# Patient Record
Sex: Male | Born: 1980 | Race: White | Hispanic: No | Marital: Married | Smoking: Former smoker
Health system: Southern US, Community
[De-identification: ages and names within clinical notes are randomized; demographics above are authoritative.]

## PROBLEM LIST (undated history)

## (undated) DIAGNOSIS — N200 Calculus of kidney: Secondary | ICD-10-CM

## (undated) HISTORY — PX: NOSE SURGERY: SHX723

---

## 2000-09-30 ENCOUNTER — Emergency Department (HOSPITAL_COMMUNITY): Admission: EM | Admit: 2000-09-30 | Discharge: 2000-10-01 | Payer: Self-pay | Admitting: Emergency Medicine

## 2002-10-21 ENCOUNTER — Emergency Department (HOSPITAL_COMMUNITY): Admission: EM | Admit: 2002-10-21 | Discharge: 2002-10-21 | Payer: Self-pay | Admitting: Emergency Medicine

## 2016-06-19 ENCOUNTER — Encounter: Payer: Self-pay | Admitting: Emergency Medicine

## 2016-06-19 ENCOUNTER — Emergency Department
Admission: EM | Admit: 2016-06-19 | Discharge: 2016-06-19 | Disposition: A | Discharge status: Home / Self Care | Payer: Self-pay | Attending: Emergency Medicine | Admitting: Emergency Medicine

## 2016-06-19 ENCOUNTER — Emergency Department: Payer: Self-pay

## 2016-06-19 DIAGNOSIS — F17200 Nicotine dependence, unspecified, uncomplicated: Secondary | ICD-10-CM | POA: Insufficient documentation

## 2016-06-19 DIAGNOSIS — R1030 Lower abdominal pain, unspecified: Secondary | ICD-10-CM | POA: Insufficient documentation

## 2016-06-19 DIAGNOSIS — N200 Calculus of kidney: Secondary | ICD-10-CM | POA: Insufficient documentation

## 2016-06-19 HISTORY — DX: Calculus of kidney: N20.0

## 2016-06-19 LAB — URINALYSIS COMPLETE WITH MICROSCOPIC (ARMC ONLY)
Bacteria, UA: NONE SEEN
Bilirubin Urine: NEGATIVE
Glucose, UA: NEGATIVE mg/dL
Ketones, ur: NEGATIVE mg/dL
Leukocytes, UA: NEGATIVE
Nitrite: NEGATIVE
PROTEIN: NEGATIVE mg/dL
Specific Gravity, Urine: 1.019 (ref 1.005–1.030)
pH: 6 (ref 5.0–8.0)

## 2016-06-19 LAB — CHLAMYDIA/NGC RT PCR (ARMC ONLY)
CHLAMYDIA TR: NOT DETECTED
N GONORRHOEAE: NOT DETECTED

## 2016-06-19 MED ORDER — HYDROMORPHONE HCL 2 MG PO TABS: 2.0000 mg | ORAL_TABLET | Freq: Two times a day (BID) | ORAL | Status: DC | PRN

## 2016-06-19 MED ORDER — ONDANSETRON HCL 4 MG PO TABS: 4.0000 mg | ORAL_TABLET | Freq: Three times a day (TID) | ORAL | Status: DC | PRN

## 2016-06-19 MED ORDER — TAMSULOSIN HCL 0.4 MG PO CAPS: 0.4000 mg | ORAL_CAPSULE | Freq: Every day | ORAL | Status: AC

## 2016-06-19 MED ORDER — IBUPROFEN 600 MG PO TABS
600.0000 mg | ORAL_TABLET | Freq: Once | ORAL | Status: AC
  Administered 2016-06-19: 600 mg via ORAL
  Filled 2016-06-19: qty 1

## 2016-06-19 NOTE — Discharge Instructions (Signed)
Kidney Stones °Kidney stones (urolithiasis) are deposits that form inside your kidneys. The intense pain is caused by the stone moving through the urinary tract. When the stone moves, the ureter goes into spasm around the stone. The stone is usually passed in the urine.  °CAUSES  °· A disorder that makes certain neck glands produce too much parathyroid hormone (primary hyperparathyroidism). °· A buildup of uric acid crystals, similar to gout in your joints. °· Narrowing (stricture) of the ureter. °· A kidney obstruction present at birth (congenital obstruction). °· Previous surgery on the kidney or ureters. °· Numerous kidney infections. °SYMPTOMS  °· Feeling sick to your stomach (nauseous). °· Throwing up (vomiting). °· Blood in the urine (hematuria). °· Pain that usually spreads (radiates) to the groin. °· Frequency or urgency of urination. °DIAGNOSIS  °· Taking a history and physical exam. °· Blood or urine tests. °· CT scan. °· Occasionally, an examination of the inside of the urinary bladder (cystoscopy) is performed. °TREATMENT  °· Observation. °· Increasing your fluid intake. °· Extracorporeal shock wave lithotripsy--This is a noninvasive procedure that uses shock waves to break up kidney stones. °· Surgery may be needed if you have severe pain or persistent obstruction. There are various surgical procedures. Most of the procedures are performed with the use of small instruments. Only small incisions are needed to accommodate these instruments, so recovery time is minimized. °The size, location, and chemical composition are all important variables that will determine the proper choice of action for you. Talk to your health care provider to better understand your situation so that you will minimize the risk of injury to yourself and your kidney.  °HOME CARE INSTRUCTIONS  °· Drink enough water and fluids to keep your urine clear or pale yellow. This will help you to pass the stone or stone fragments. °· Strain  all urine through the provided strainer. Keep all particulate matter and stones for your health care provider to see. The stone causing the pain may be as small as a grain of salt. It is very important to use the strainer each and every time you pass your urine. The collection of your stone will allow your health care provider to analyze it and verify that a stone has actually passed. The stone analysis will often identify what you can do to reduce the incidence of recurrences. °· Only take over-the-counter or prescription medicines for pain, discomfort, or fever as directed by your health care provider. °· Keep all follow-up visits as told by your health care provider. This is important. °· Get follow-up X-rays if required. The absence of pain does not always mean that the stone has passed. It may have only stopped moving. If the urine remains completely obstructed, it can cause loss of kidney function or even complete destruction of the kidney. It is your responsibility to make sure X-rays and follow-ups are completed. Ultrasounds of the kidney can show blockages and the status of the kidney. Ultrasounds are not associated with any radiation and can be performed easily in a matter of minutes. °· Make changes to your daily diet as told by your health care provider. You may be told to: °¨ Limit the amount of salt that you eat. °¨ Eat 5 or more servings of fruits and vegetables each day. °¨ Limit the amount of meat, poultry, fish, and eggs that you eat. °· Collect a 24-hour urine sample as told by your health care provider. You may need to collect another urine sample every 6-12   months. °SEEK MEDICAL CARE IF: °· You experience pain that is progressive and unresponsive to any pain medicine you have been prescribed. °SEEK IMMEDIATE MEDICAL CARE IF:  °· Pain cannot be controlled with the prescribed medicine. °· You have a fever or shaking chills. °· The severity or intensity of pain increases over 18 hours and is not  relieved by pain medicine. °· You develop a new onset of abdominal pain. °· You feel faint or pass out. °· You are unable to urinate. °  °This information is not intended to replace advice given to you by your health care provider. Make sure you discuss any questions you have with your health care provider. °  °Document Released: 12/15/2005 Document Revised: 09/05/2015 Document Reviewed: 05/18/2013 °Elsevier Interactive Patient Education ©2016 [employer] ° °Please return immediately if condition worsens. Please contact her primary physician or the physician you were given for referral. If you have any specialist physicians involved in her treatment and plan please also contact them. Thank you for using [facility]. ° °

## 2016-06-19 NOTE — ED Notes (Signed)
Pt complains of urinary frequency for one week and states today started having pain in left side of back and lower abdomen. Pt has history of kidney stones.

## 2016-06-19 NOTE — ED Notes (Signed)
Patient transported to CT 

## 2016-06-19 NOTE — ED Provider Notes (Signed)
Time Seen: Approximately 0 750 I have reviewed the triage notes  Chief Complaint: Urinary Frequency   History of Present Illness: Bradley Parks is a 35 y.o. male who presents with urinary frequency and burning with urination. Patient states he has a urgency to void and occasionally has small dribbling amounts of urine. He denies any hematuria. He states he's been doing okay in the symptoms started on Sunday up until today. He states he was at work today and the pain seemed to radiate toward his left back and groin area. He denies any penile discharge or drainage. He states he is sexually active and denies that his partner is having any difficulties. He denies any fever or rashes at home. He denies any joint pain.   Past Medical History  Diagnosis Date  . Kidney stones     There are no active problems to display for this patient.   History reviewed. No pertinent past surgical history.  History reviewed. No pertinent past surgical history.  No current outpatient prescriptions on file.  Allergies:  Penicillins  Family History: History reviewed. No pertinent family history.  Social History: Social History  Substance Use Topics  . Smoking status: Current Some Day Smoker  . Smokeless tobacco: None  . Alcohol Use: Yes     Comment: occ.     Review of Systems:   10 point review of systems was performed and was otherwise negative:  Constitutional: No fever Eyes: No visual disturbances ENT: No sore throat, ear pain Cardiac: No chest pain Respiratory: No shortness of breath, wheezing, or stridor Abdomen: Mild middle lower quadrant abdominal pain Endocrine: No weight loss, No night sweats Extremities: No peripheral edema, cyanosis Skin: No rashes, easy bruising Neurologic: No focal weakness, trouble with speech or swollowing Urologic: Burning and urinary frequency, no hematuria *  Physical Exam:  ED Triage Vitals  Enc Vitals Group     BP 06/19/16 0745 123/82 mmHg      Pulse Rate 06/19/16 0745 58     Resp 06/19/16 0745 18     Temp 06/19/16 0745 97.3 F (36.3 C)     Temp Source 06/19/16 0745 Oral     SpO2 06/19/16 0745 99 %     Weight 06/19/16 0745 133 lb (60.328 kg)     Height 06/19/16 0745 5\' 8"  (1.727 m)     Head Cir --      Peak Flow --      Pain Score 06/19/16 0745 7     Pain Loc --      Pain Edu? --      Excl. in GC? --     General: Awake , Alert , and Oriented times 3; GCS 15 Head: Normal cephalic , atraumatic Eyes: Pupils equal , round, reactive to light Nose/Throat: No nasal drainage, patent upper airway without erythema or exudate.  Neck: Supple, Full range of motion, No anterior adenopathy or palpable thyroid masses Lungs: Clear to ascultation without wheezes , rhonchi, or rales Heart: Regular rate, regular rhythm without murmurs , gallops , or rubs Abdomen: Soft, non tender without rebound, guarding , or rigidity; bowel sounds positive and symmetric in all 4 quadrants. No organomegaly .        Extremities: 2 plus symmetric pulses. No edema, clubbing or cyanosis Neurologic: normal ambulation, Motor symmetric without deficits, sensory intact Skin: warm, dry, no rashes Genital examination shows no testicular pain or masses no groin adenopathy. No active penile discharge or drainage.  Labs:   All  laboratory work was reviewed including any pertinent negatives or positives listed below:  Labs Reviewed  URINALYSIS COMPLETEWITH MICROSCOPIC ([facility] ONLY) - Abnormal; Notable for the following:    Color, Urine YELLOW (*)    APPearance CLEAR (*)    Hgb urine dipstick 3+ (*)    Squamous Epithelial / LPF 0-5 (*)    All other components within normal limits  CHLAMYDIA/NGC RT PCR ([facility] ONLY)    Radiology:      CT RENAL STONE STUDY (Final result) Result time: 06/19/16 09:39:53   Final result by Rad Results In Interface (06/19/16 09:39:53)   Narrative:   CLINICAL DATA: Left side back pain, urinary frequency for 1  week, history of kidney stones  EXAM: CT ABDOMEN AND PELVIS WITHOUT CONTRAST  TECHNIQUE: Multidetector CT imaging of the abdomen and pelvis was performed following the standard protocol without IV contrast.  COMPARISON: None.  FINDINGS: Lower chest: The lung bases are unremarkable. Mild thickening of distal esophageal wall up to 5 mm. Gastroesophageal reflux disease cannot be excluded.  Hepatobiliary: Unenhanced liver is unremarkable. Gallbladder is contracted without evidence of calcified gallstones.  Pancreas: Unenhanced pancreas is unremarkable.  Spleen: Unenhanced spleen is unremarkable.  Adrenals/Urinary Tract: Unenhanced adrenal glands are unremarkable. There is minimal left hydronephrosis. Mild distal left hydroureter. Nonobstructive punctate calcified calculus in midpole of the left kidney measures 1 mm. At least 3 nonobstructive calcified calculi are noted within right kidney the largest in midpole measures 6 mm.  In axial image 66 status partially obstructive calculus in distal left ureter/left UVJ measures 5.6 x 4.6 mm. Limited assessment of the urinary bladder which is empty collapsed. No definite right ureteral calculi are noted.  Stomach/Bowel: No small bowel obstruction. No thickened or dilated small bowel loops. Normal appendix is noted in axial image 55. Terminal ileum is unremarkable. No distal colitis or diverticulitis. No evidence of colonic obstruction.  Vascular/Lymphatic: No aortic aneurysm. No retroperitoneal or mesenteric adenopathy.  Reproductive: Prostate gland and seminal vesicles are unremarkable. No pelvic mass is noted.  Other: No ascites or free abdominal air.  Musculoskeletal: No destructive bony lesions are noted. Sagittal images of the spine are unremarkable.  IMPRESSION: 1. Bilateral nonobstructive nephrolithiasis is noted. 2. There is minimal left hydronephrosis. Mild distal left hydroureter. 3. There is 5.6 x 4.6 mm  calcified calculus in distal left ureter/left UVJ. Limited assessment of urinary bladder which is empty. 4. No small bowel obstruction. 5. Normal appendix. No pericecal inflammation. 6. No ascites or free abdominal air.   Electronically Signed By: Natasha MeadLiviu Pop M.D. On: 06/19/2016 09:39    I personally reviewed the radiologic studies    ED Course:  Patient's differential included an sexually transmitted disease, urinary tract infection, prostatitis, renal colic, etc. Given his current clinical presentation and objective findings appears he has a large left-sided kidney stone located in the distal UVJ region. Patient is overall comfortable at this point and I felt could be discharged with prescription medication. He is part of the [facility] system is otherwise contact his urologist there for further outpatient follow-up and was also referred locally to urology unassigned. Patient was advised drink plenty of fluids, strain the urine and save the stone if one becomes available, and to take over-the-counter ibuprofen for pain.    Assessment:  Renal colic  Final Clinical Impression:   Final diagnoses:  Lower abdominal pain     Plan:  Outpatient Prescriptions for Dilaudid, Zofran, Flomax Patient was advised to return immediately if condition worsens. Patient was advised  to follow up with their primary care physician or other specialized physicians involved in their outpatient care. The patient and/or family member/power of attorney had laboratory results reviewed at the bedside. All questions and concerns were addressed and appropriate discharge instructions were distributed by the nursing staff.             Jennye Moccasin, MD 06/19/16 1009

## 2017-05-21 IMAGING — CT Abdomen^STONE_[ID] (Adult)
3 of 4 series · 5 of 16 positions shown, 9 images · non-contrast
Comparison: None.

CLINICAL DATA: Left side back pain, urinary frequency for 1 week,
history of kidney stones

EXAM:
CT ABDOMEN AND PELVIS WITHOUT CONTRAST
TECHNIQUE: Multidetector CT imaging of the abdomen and pelvis was performed
following the standard protocol without IV contrast.

[Series 4: lung · axial · 0.69mm/px · z∈[-176,-176]mm · 1 of 20 slices shown, 3 images]
[im 1/20  soft-tissue]
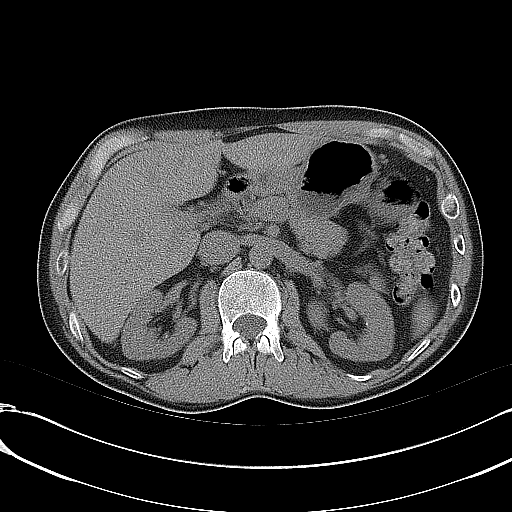
[im 1/20  lung]
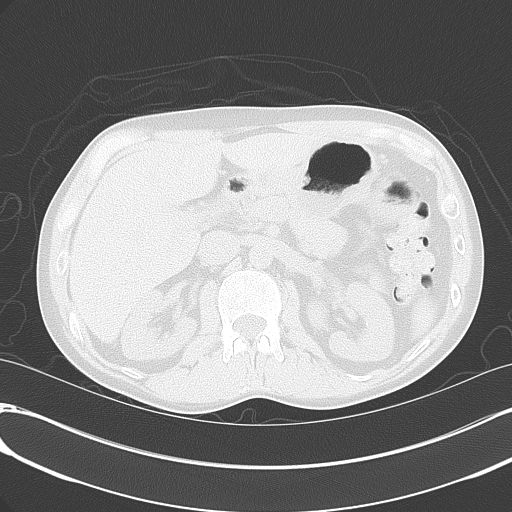
[im 1/20  bone]
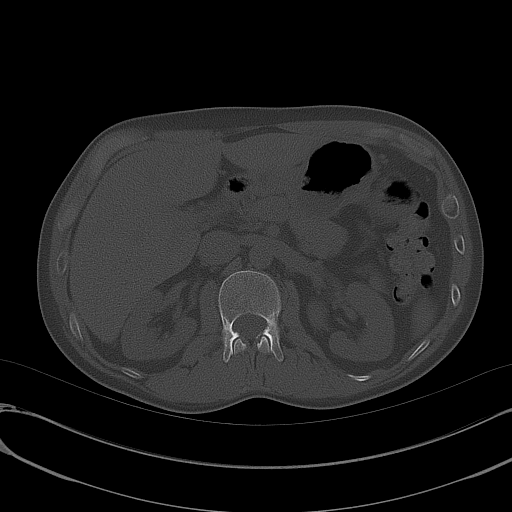

[Series 5: coronal · coronal · 0.70mm/px · 3 of 111 slices shown, 4 images]
[im 28/111  soft-tissue]
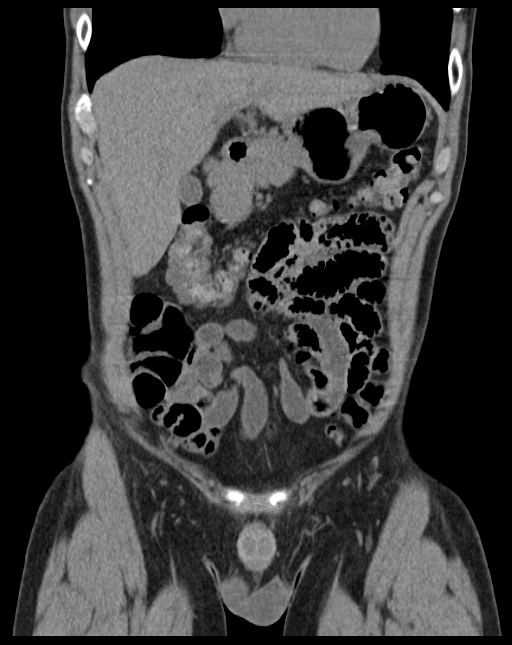
[im 56/111  soft-tissue]
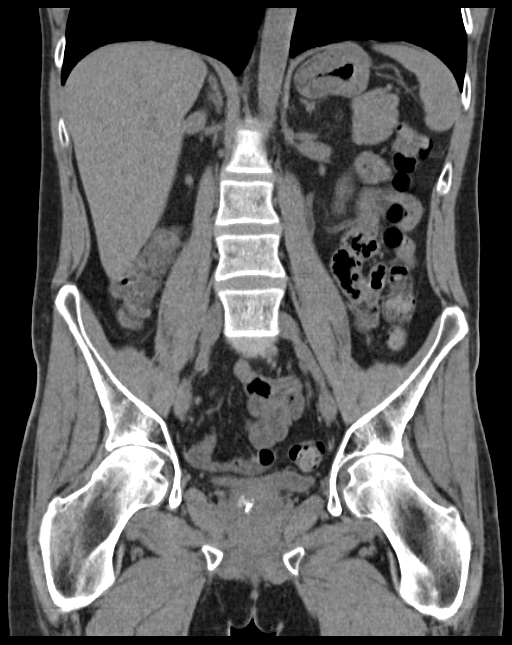
[im 56/111  bone]
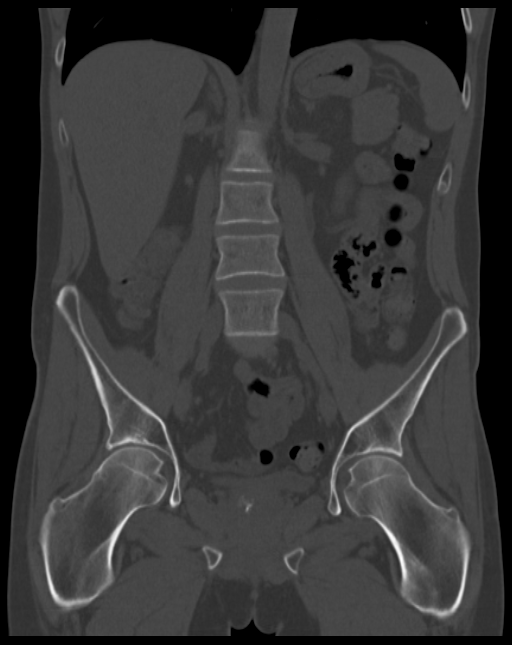
[im 83/111  soft-tissue]
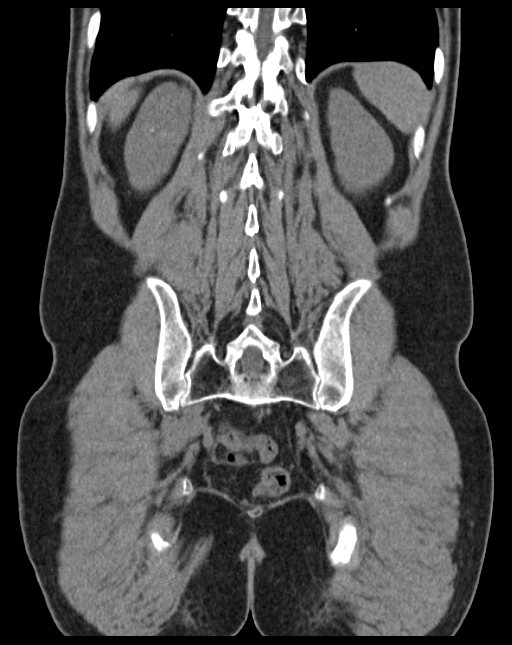

[Series 6: sagittal · sagittal · 0.50mm/px · 1 of 163 slices shown, 2 images]
[im 82/163  soft-tissue]
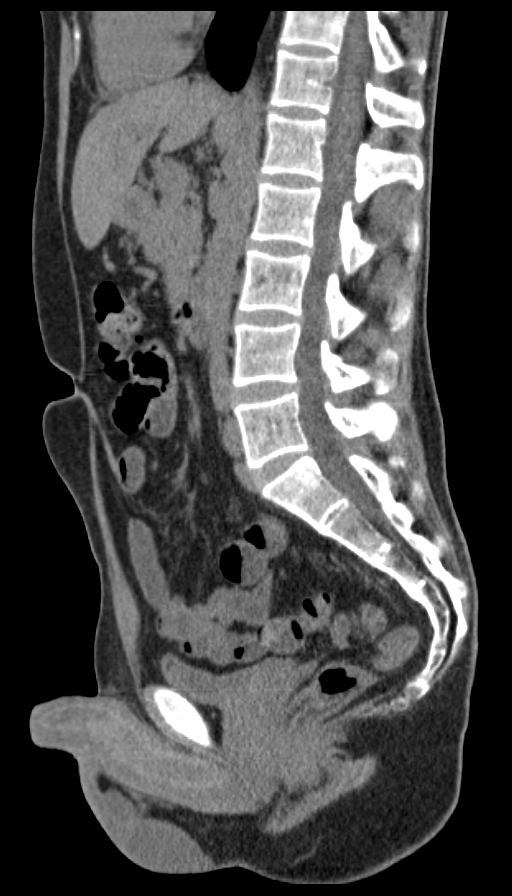
[im 82/163  bone]
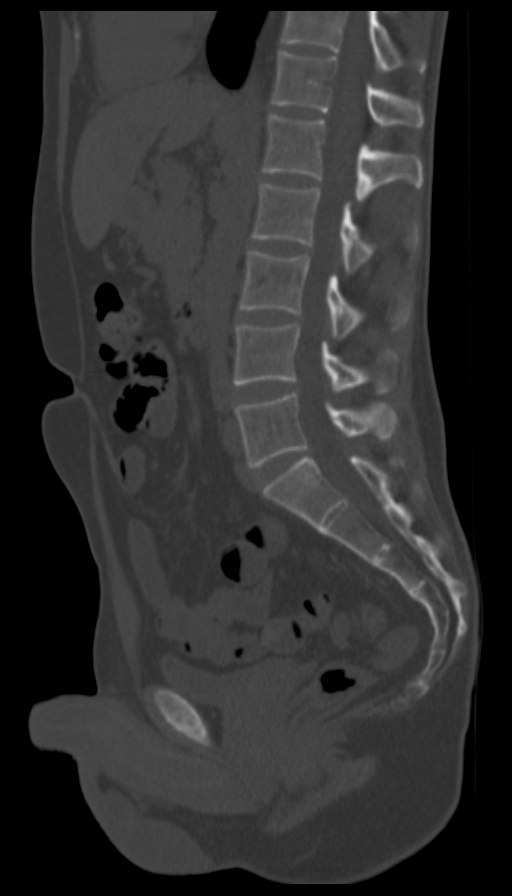

[5 of 16 positions shown; findings below may reference images not displayed]

FINDINGS: Lower chest: The lung bases are unremarkable. Mild thickening of
distal esophageal wall up to 5 mm. Gastroesophageal reflux disease
cannot be excluded.

Hepatobiliary: Unenhanced liver is unremarkable. Gallbladder is
contracted without evidence of calcified gallstones.

Pancreas: Unenhanced pancreas is unremarkable.

Spleen: Unenhanced spleen is unremarkable.

Adrenals/Urinary Tract: Unenhanced adrenal glands are unremarkable.
There is minimal left hydronephrosis. Mild distal left hydroureter.
Nonobstructive punctate calcified calculus in midpole of the left
kidney measures 1 mm. At least 3 nonobstructive calcified calculi
are noted within right kidney the largest in midpole measures 6 mm.

In axial image 66 status partially obstructive calculus in distal
left ureter/left UVJ measures 5.6 x 4.6 mm. Limited assessment of
the urinary bladder which is empty collapsed. No definite right
ureteral calculi are noted.

Stomach/Bowel: No small bowel obstruction. No thickened or dilated
small bowel loops. Normal appendix is noted in axial image 55.
Terminal ileum is unremarkable. No distal colitis or diverticulitis.
No evidence of colonic obstruction.

Vascular/Lymphatic: No aortic aneurysm. No retroperitoneal or
mesenteric adenopathy.

Reproductive: Prostate gland and seminal vesicles are unremarkable.
No pelvic mass is noted.

Other: No ascites or free abdominal air.

Musculoskeletal: No destructive bony lesions are noted. Sagittal
images of the spine are unremarkable.
IMPRESSION: 1. Bilateral nonobstructive nephrolithiasis is noted.
2. There is minimal left hydronephrosis. Mild distal left
hydroureter.
3. There is 5.6 x 4.6 mm calcified calculus in distal left
ureter/left UVJ. Limited assessment of urinary bladder which is
empty.
4. No small bowel obstruction.
5. Normal appendix.  No pericecal inflammation.
6. No ascites or free abdominal air.

## 2018-07-07 ENCOUNTER — Encounter (HOSPITAL_BASED_OUTPATIENT_CLINIC_OR_DEPARTMENT_OTHER): Payer: Self-pay

## 2018-07-07 ENCOUNTER — Emergency Department (HOSPITAL_BASED_OUTPATIENT_CLINIC_OR_DEPARTMENT_OTHER)
Admission: EM | Admit: 2018-07-07 | Discharge: 2018-07-07 | Disposition: A | Discharge status: Home / Self Care | Payer: Non-veteran care | Attending: Emergency Medicine | Admitting: Emergency Medicine

## 2018-07-07 ENCOUNTER — Other Ambulatory Visit: Payer: Self-pay

## 2018-07-07 DIAGNOSIS — R55 Syncope and collapse: Secondary | ICD-10-CM | POA: Insufficient documentation

## 2018-07-07 DIAGNOSIS — R5383 Other fatigue: Secondary | ICD-10-CM | POA: Diagnosis present

## 2018-07-07 DIAGNOSIS — Z79899 Other long term (current) drug therapy: Secondary | ICD-10-CM | POA: Diagnosis not present

## 2018-07-07 DIAGNOSIS — F17200 Nicotine dependence, unspecified, uncomplicated: Secondary | ICD-10-CM | POA: Diagnosis not present

## 2018-07-07 LAB — COMPREHENSIVE METABOLIC PANEL
ALK PHOS: 72 U/L (ref 38–126)
ALT: 13 U/L (ref 0–44)
ANION GAP: 7 (ref 5–15)
AST: 17 U/L (ref 15–41)
Albumin: 4.6 g/dL (ref 3.5–5.0)
BUN: 14 mg/dL (ref 6–20)
CALCIUM: 9 mg/dL (ref 8.9–10.3)
CHLORIDE: 105 mmol/L (ref 98–111)
CO2: 28 mmol/L (ref 22–32)
CREATININE: 0.77 mg/dL (ref 0.61–1.24)
Glucose, Bld: 103 mg/dL — ABNORMAL HIGH (ref 70–99)
Potassium: 3.7 mmol/L (ref 3.5–5.1)
SODIUM: 140 mmol/L (ref 135–145)
Total Bilirubin: 0.5 mg/dL (ref 0.3–1.2)
Total Protein: 7.3 g/dL (ref 6.5–8.1)

## 2018-07-07 LAB — CBC WITH DIFFERENTIAL/PLATELET
Basophils Absolute: 0 10*3/uL (ref 0.0–0.1)
Basophils Relative: 0 %
EOS ABS: 0 10*3/uL (ref 0.0–0.7)
EOS PCT: 0 %
HCT: 41.5 % (ref 39.0–52.0)
Hemoglobin: 14.8 g/dL (ref 13.0–17.0)
LYMPHS ABS: 1.2 10*3/uL (ref 0.7–4.0)
LYMPHS PCT: 19 %
MCH: 31 pg (ref 26.0–34.0)
MCHC: 35.7 g/dL (ref 30.0–36.0)
MCV: 87 fL (ref 78.0–100.0)
MONO ABS: 0.3 10*3/uL (ref 0.1–1.0)
MONOS PCT: 5 %
Neutro Abs: 4.8 10*3/uL (ref 1.7–7.7)
Neutrophils Relative %: 76 %
PLATELETS: 176 10*3/uL (ref 150–400)
RBC: 4.77 MIL/uL (ref 4.22–5.81)
RDW: 11.7 % (ref 11.5–15.5)
WBC: 6.3 10*3/uL (ref 4.0–10.5)

## 2018-07-07 LAB — CBG MONITORING, ED: Glucose-Capillary: 93 mg/dL (ref 70–99)

## 2018-07-07 MED ORDER — SODIUM CHLORIDE 0.9 % IV BOLUS
1000.0000 mL | Freq: Once | INTRAVENOUS | Status: AC
  Administered 2018-07-07: 1000 mL via INTRAVENOUS

## 2018-07-07 NOTE — ED Provider Notes (Signed)
[facility] EMERGENCY DEPARTMENT Provider Note   CSN: [id number] Arrival date & time: 07/07/18  1652     History   Chief Complaint Chief Complaint  Patient presents with  . Fatigue    HPI Bauer Ausborn is a 37 y.o. male.  HPI  37 year old male presents with near syncope.  He states that he started to feel tired on the way to work.  He states that his car does not have air conditioning and it was very hot today.  However he thought this was odd because he drank multiple cups of coffee this morning.  While at work he was continued to feel tired and was getting ready to poor another cup of coffee but he started to feel even more tired and lightheaded.  He states he had a hard time keeping his eyes open.  No palpitations, chest pain, shortness of breath.  No focal weakness.  He never did actually pass out and felt better after sitting down.  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  Right now he feels tired but otherwise has no acute complaints.  He does feel little mild headache and a little nausea but never had severe headache.  He did not eat food today and states he has not been drinking water but was working out school work this morning.  Past Medical History:  Diagnosis Date  . Kidney stones     There are no active problems to display for this patient.   History reviewed. No pertinent surgical history.      Home Medications    Prior to Admission medications   Medication Sig Start Date End Date Taking? Authorizing Provider  primidone (MYSOLINE) 50 MG tablet Take 100 mg by mouth 2 (two) times daily.   Yes [provider]  escitalopram (LEXAPRO) 10 MG tablet Take 10 mg by mouth daily.    [provider]    Family History No family history on file.  Social History Social History   Tobacco Use  . Smoking status: Current Some Day Smoker    Substance Use Topics  . Alcohol use: Yes    Comment: occ.  . Drug use: No     Allergies   Penicillins   Review of Systems Review of Systems  Constitutional: Positive for fatigue.  Eyes: Negative for visual disturbance.  Respiratory: Negative for shortness of breath.   Cardiovascular: Negative for chest pain and palpitations.  Gastrointestinal: Negative for abdominal pain and vomiting.  Neurological: Negative for weakness, numbness and headaches.  All other systems reviewed and are negative.    Physical Exam Updated Vital Signs BP 112/75   Pulse (!) 55   Temp 97.9 F (36.6 C) (Oral)   Resp 19   Ht 5\' 7"  (1.702 m)   Wt 61.2 kg (135 lb)   SpO2 98%   BMI 21.14 kg/m   Physical Exam  Constitutional: He is oriented to person, place, and time. He appears well-developed and well-nourished.  HENT:  Head: Normocephalic and atraumatic.  Right Ear: External ear normal.  Left Ear: External ear normal.  Nose: Nose normal.  Eyes: Pupils are equal, round, and reactive to light. EOM are normal. Right eye exhibits no discharge. Left eye exhibits no discharge.  Neck: Neck supple.  Cardiovascular: Normal rate, regular rhythm and normal heart sounds.  Pulmonary/Chest: Effort normal and breath  sounds normal.  Abdominal: Soft. There is no tenderness.  Musculoskeletal: He exhibits no edema.  Neurological: He is alert and oriented to person, place, and time.  CN 3-12 grossly intact. 5/5 strength in all 4 extremities. Grossly normal sensation. Normal finger to nose.   Skin: Skin is warm and dry.  Nursing note and vitals reviewed.    ED Treatments / Results  Labs (all labs ordered are listed, but only abnormal results are displayed) Labs Reviewed  COMPREHENSIVE METABOLIC PANEL - Abnormal; Notable for the following components:      Result Value   Glucose, Bld 103 (*)    All other components within normal limits  CBC WITH DIFFERENTIAL/PLATELET  CBG MONITORING, ED    EKG EKG  Interpretation  Date/Time:  Wednesday July 07 2018 17:08:36 EDT Ventricular Rate:  57 PR Interval:    QRS Duration: 108 QT Interval:  422 QTC Calculation: 411 R Axis:   77 Text Interpretation:  Sinus rhythm Short PR interval No old tracing to compare Confirmed by Pricilla LovelessGoldston, Izadora Roehr [phone]([id number]) on 07/07/2018 5:28:25 PM   Radiology No results found.  Procedures Procedures (including critical care time)  Medications Ordered in ED Medications  sodium chloride 0.9 % bolus 1,000 mL (0 mLs Intravenous Stopped 07/07/18 1904)     Initial Impression / Assessment and Plan / ED Course  I have reviewed the triage vital signs and the nursing notes.  Pertinent labs & imaging results that were available during my care of the patient were reviewed by me and considered in my medical decision making (see chart for details).     Presentation is consistent with near syncope.  However he has no alarming findings on history or presentation that would suggest cardiac cause or neurologic dysfunction/cause.  His lab work is reassuring.  ECG without acute emergent abnormalities.  I doubt an arrhythmia.  Likely a combination of multiple factors such as the significant heat, not eating and drinking well this morning.  Discussed better hydration as well as need for follow-up with PCP.  Appears stable for discharge home.  Return precautions.  Final Clinical Impressions(s) / ED Diagnoses   Final diagnoses:  Near syncope    ED Discharge Orders    None       Pricilla LovelessGoldston, Emmary Culbreath, MD 07/07/18 [id number]

## 2018-07-07 NOTE — ED Notes (Signed)
ED Provider at bedside. 

## 2018-07-07 NOTE — ED Triage Notes (Addendum)
Pt states he was at work and began feeling tired and weak, he drank some fluids and had some crackers and he has improved slightly, denies CP, denies SOB, denies dizziness, uses cane to ambulate because he "falls frequently," working with neurology at the [facility]

## 2018-07-07 NOTE — Discharge Instructions (Signed)
If you develop recurrent or worsening symptoms or you develop chest pain, shortness of breath, fever, vomiting, or other new/concerning symptoms or return to the ER for evaluation.  Otherwise drink plenty of fluids and follow-up with your primary care physician.

## 2018-08-24 ENCOUNTER — Encounter: Payer: Self-pay | Admitting: Psychiatry

## 2018-08-24 ENCOUNTER — Ambulatory Visit (INDEPENDENT_AMBULATORY_CARE_PROVIDER_SITE_OTHER): Payer: Non-veteran care | Admitting: Psychiatry

## 2018-08-24 VITALS — BP 123/80 | HR 52 | Temp 97.6°F | Wt 132.2 lb

## 2018-08-24 DIAGNOSIS — F330 Major depressive disorder, recurrent, mild: Secondary | ICD-10-CM

## 2018-08-24 DIAGNOSIS — F411 Generalized anxiety disorder: Secondary | ICD-10-CM

## 2018-08-24 DIAGNOSIS — F1321 Sedative, hypnotic or anxiolytic dependence, in remission: Secondary | ICD-10-CM

## 2018-08-24 DIAGNOSIS — F4010 Social phobia, unspecified: Secondary | ICD-10-CM | POA: Diagnosis not present

## 2018-08-24 DIAGNOSIS — F1021 Alcohol dependence, in remission: Secondary | ICD-10-CM

## 2018-08-24 DIAGNOSIS — R2689 Other abnormalities of gait and mobility: Secondary | ICD-10-CM | POA: Insufficient documentation

## 2018-08-24 MED ORDER — BUPROPION HCL 75 MG PO TABS: 75.0000 mg | ORAL_TABLET | Freq: Every day | ORAL | 1 refills | Status: DC

## 2018-08-24 MED ORDER — BUSPIRONE HCL 10 MG PO TABS: 5.0000 mg | ORAL_TABLET | Freq: Two times a day (BID) | ORAL | 1 refills | Status: DC

## 2018-08-24 MED ORDER — ESCITALOPRAM OXALATE 10 MG PO TABS: 15.0000 mg | ORAL_TABLET | Freq: Every day | ORAL | 1 refills | Status: DC

## 2018-08-24 NOTE — Patient Instructions (Signed)
Buspirone tablets What is this medicine? BUSPIRONE (byoo SPYE rone) is used to treat anxiety disorders. This medicine may be used for other purposes; ask your health care provider or pharmacist if you have questions. COMMON BRAND NAME(S): BuSpar What should I tell my health care provider before I take this medicine? They need to know if you have any of these conditions: -kidney or liver disease -an unusual or allergic reaction to buspirone, other medicines, foods, dyes, or preservatives -pregnant or trying to get pregnant -breast-feeding How should I use this medicine? Take this medicine by mouth with a glass of water. Follow the directions on the prescription label. You may take this medicine with or without food. To ensure that this medicine always works the same way for you, you should take it either always with or always without food. Take your doses at regular intervals. Do not take your medicine more often than directed. Do not stop taking except on the advice of your doctor or health care professional. Talk to your pediatrician regarding the use of this medicine in children. Special care may be needed. Overdosage: If you think you have taken too much of this medicine contact a poison control center or emergency room at once. NOTE: This medicine is only for you. Do not share this medicine with others. What if I miss a dose? If you miss a dose, take it as soon as you can. If it is almost time for your next dose, take only that dose. Do not take double or extra doses. What may interact with this medicine? Do not take this medicine with any of the following medications: -linezolid -MAOIs like Carbex, Eldepryl, Marplan, Nardil, and Parnate -methylene blue -procarbazine This medicine may also interact with the following medications: -diazepam -digoxin -diltiazem -erythromycin -grapefruit juice -haloperidol -medicines for mental depression or mood problems -medicines for seizures like  carbamazepine, phenobarbital and phenytoin -nefazodone -other medications for anxiety -rifampin -ritonavir -some antifungal medicines like itraconazole, ketoconazole, and voriconazole -verapamil -warfarin This list may not describe all possible interactions. Give your health care provider a list of all the medicines, herbs, non-prescription drugs, or dietary supplements you use. Also tell them if you smoke, drink alcohol, or use illegal drugs. Some items may interact with your medicine. What should I watch for while using this medicine? Visit your doctor or health care professional for regular checks on your progress. It may take 1 to 2 weeks before your anxiety gets better. You may get drowsy or dizzy. Do not drive, use machinery, or do anything that needs mental alertness until you know how this drug affects you. Do not stand or sit up quickly, especially if you are an older patient. This reduces the risk of dizzy or fainting spells. Alcohol can make you more drowsy and dizzy. Avoid alcoholic drinks. What side effects may I notice from receiving this medicine? Side effects that you should report to your doctor or health care professional as soon as possible: -blurred vision or other vision changes -chest pain -confusion -difficulty breathing -feelings of hostility or anger -muscle aches and pains -numbness or tingling in hands or feet -ringing in the ears -skin rash and itching -vomiting -weakness Side effects that usually do not require medical attention (report to your doctor or health care professional if they continue or are bothersome): -disturbed dreams, nightmares -headache -nausea -restlessness or nervousness -sore throat and nasal congestion -stomach upset This list may not describe all possible side effects. Call your doctor for medical advice about side   effects. You may report side effects to FDA at 1-800-FDA-1088. Where should I keep my medicine? Keep out of the reach  of children. Store at room temperature below 30 degrees C (86 degrees F). Protect from light. Keep container tightly closed. Throw away any unused medicine after the expiration date. NOTE: This sheet is a summary. It may not cover all possible information. If you have questions about this medicine, talk to your doctor, pharmacist, or health care provider.  2018 Elsevier/Gold Standard (2010-07-25 18:06:11) Bupropion tablets (Depression/Mood Disorders) What is this medicine? BUPROPION (byoo PROE pee on) is used to treat depression. This medicine may be used for other purposes; ask your health care provider or pharmacist if you have questions. COMMON BRAND NAME(S): Wellbutrin What should I tell my health care provider before I take this medicine? They need to know if you have any of these conditions: -an eating disorder, such as anorexia or bulimia -bipolar disorder or psychosis -diabetes or high blood sugar, treated with medication -glaucoma -heart disease, previous heart attack, or irregular heart beat -head injury or brain tumor -high blood pressure -kidney or liver disease -seizures -suicidal thoughts or a previous suicide attempt -Tourette's syndrome -weight loss -an unusual or allergic reaction to bupropion, other medicines, foods, dyes, or preservatives -breast-feeding -pregnant or trying to become pregnant How should I use this medicine? Take this medicine by mouth with a glass of water. Follow the directions on the prescription label. You can take it with or without food. If it upsets your stomach, take it with food. Take your medicine at regular intervals. Do not take your medicine more often than directed. Do not stop taking this medicine suddenly except upon the advice of your doctor. Stopping this medicine too quickly may cause serious side effects or your condition may worsen. A special MedGuide will be given to you by the pharmacist with each prescription and refill. Be sure  to read this information carefully each time. Talk to your pediatrician regarding the use of this medicine in children. Special care may be needed. Overdosage: If you think you have taken too much of this medicine contact a poison control center or emergency room at once. NOTE: This medicine is only for you. Do not share this medicine with others. What if I miss a dose? If you miss a dose, take it as soon as you can. If it is less than four hours to your next dose, take only that dose and skip the missed dose. Do not take double or extra doses. What may interact with this medicine? Do not take this medicine with any of the following medications: -linezolid -MAOIs like Azilect, Carbex, Eldepryl, Marplan, Nardil, and Parnate -methylene blue (injected into a vein) -other medicines that contain bupropion like Zyban This medicine may also interact with the following medications: -alcohol -certain medicines for anxiety or sleep -certain medicines for blood pressure like metoprolol, propranolol -certain medicines for depression or psychotic disturbances -certain medicines for HIV or AIDS like efavirenz, lopinavir, nelfinavir, ritonavir -certain medicines for irregular heart beat like propafenone, flecainide -certain medicines for Parkinson's disease like amantadine, levodopa -certain medicines for seizures like carbamazepine, phenytoin, phenobarbital -cimetidine -clopidogrel -cyclophosphamide -digoxin -furazolidone -isoniazid -nicotine -orphenadrine -procarbazine -steroid medicines like prednisone or cortisone -stimulant medicines for attention disorders, weight loss, or to stay awake -tamoxifen -theophylline -thiotepa -ticlopidine -tramadol -warfarin This list may not describe all possible interactions. Give your health care provider a list of all the medicines, herbs, non-prescription drugs, or dietary supplements you use. Also   tell them if you smoke, drink alcohol, or use illegal  drugs. Some items may interact with your medicine. What should I watch for while using this medicine? Tell your doctor if your symptoms do not get better or if they get worse. Visit your doctor or health care professional for regular checks on your progress. Because it may take several weeks to see the full effects of this medicine, it is important to continue your treatment as prescribed by your doctor. Patients and their families should watch out for new or worsening thoughts of suicide or depression. Also watch out for sudden changes in feelings such as feeling anxious, agitated, panicky, irritable, hostile, aggressive, impulsive, severely restless, overly excited and hyperactive, or not being able to sleep. If this happens, especially at the beginning of treatment or after a change in dose, call your health care professional. Avoid alcoholic drinks while taking this medicine. Drinking excessive alcoholic beverages, using sleeping or anxiety medicines, or quickly stopping the use of these agents while taking this medicine may increase your risk for a seizure. Do not drive or use heavy machinery until you know how this medicine affects you. This medicine can impair your ability to perform these tasks. Do not take this medicine close to bedtime. It may prevent you from sleeping. Your mouth may get dry. Chewing sugarless gum or sucking hard candy, and drinking plenty of water may help. Contact your doctor if the problem does not go away or is severe. What side effects may I notice from receiving this medicine? Side effects that you should report to your doctor or health care professional as soon as possible: -allergic reactions like skin rash, itching or hives, swelling of the face, lips, or tongue -breathing problems -changes in vision -confusion -elevated mood, decreased need for sleep, racing thoughts, impulsive behavior -fast or irregular heartbeat -hallucinations, loss of contact with  reality -increased blood pressure -redness, blistering, peeling or loosening of the skin, including inside the mouth -seizures -suicidal thoughts or other mood changes -unusually weak or tired -vomiting Side effects that usually do not require medical attention (report to your doctor or health care professional if they continue or are bothersome): -constipation -headache -loss of appetite -nausea -tremors -weight loss This list may not describe all possible side effects. Call your doctor for medical advice about side effects. You may report side effects to FDA at 1-800-FDA-1088. Where should I keep my medicine? Keep out of the reach of children. Store at room temperature between 20 and 25 degrees C (68 and 77 degrees F), away from direct sunlight and moisture. Keep tightly closed. Throw away any unused medicine after the expiration date. NOTE: This sheet is a summary. It may not cover all possible information. If you have questions about this medicine, talk to your doctor, pharmacist, or health care provider.  2018 Elsevier/Gold Standard (2016-06-06 13:44:21)  
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
## 2018-08-25 ENCOUNTER — Ambulatory Visit (INDEPENDENT_AMBULATORY_CARE_PROVIDER_SITE_OTHER): Payer: Non-veteran care | Admitting: Licensed Clinical Social Worker

## 2018-08-25 DIAGNOSIS — F411 Generalized anxiety disorder: Secondary | ICD-10-CM

## 2018-08-25 NOTE — Progress Notes (Signed)
Comprehensive Clinical Assessment (CCA) Note  08/25/2018 Bradley Parks [id number]  Visit Diagnosis:      ICD-10-CM   1. GAD (generalized anxiety disorder) F41.1       CCA Part One  Part One has been completed on paper by the patient.  (See scanned document in Chart Review)  CCA Part Two A  Intake/Chief Complaint:  CCA Intake With Chief Complaint CCA Part Two Date: 08/25/18 CCA Part Two Time: 1100 Chief Complaint/Presenting Problem: "I have a lot of anxiety throughout the years. I have a lot of depression--I sleep a lot."  Patients Currently Reported Symptoms/Problems: Sleeping too much, worrying  alot, really shakey. Sometimes I have a hard time getting my words out. Collateral Involvement: N/A Individual's Strengths: "I'm smart and good at working on stuff when I don't have to talk about it."  Individual's Preferences: N/A Individual's Abilities: N/A Type of Services Patient Feels Are Needed: "I don't like being nervous all the time and worrying about doing the smallest things--like walking, being in public. I think therapy would help that."   Mental Health Symptoms Depression:  Depression: Fatigue, Irritability, Sleep (too much or little), Change in energy/activity  Mania:  Mania: N/A  Anxiety:   Anxiety: Difficulty concentrating, Fatigue, Irritability, Restlessness, Sleep, Worrying, Tension  Psychosis:  Psychosis: N/A  Trauma:  Trauma: N/A  Obsessions:     Compulsions:  Compulsions: N/A  Inattention:  Inattention: Avoids/dislikes activities that require focus, Forgetful  Hyperactivity/Impulsivity:  Hyperactivity/Impulsivity: N/A  Oppositional/Defiant Behaviors:  Oppositional/Defiant Behaviors: N/A  Borderline Personality:  Emotional Irregularity: Intense/inappropriate anger, Mood lability, Unstable self-image  Other Mood/Personality Symptoms:      Mental Status Exam Appearance and self-care  Stature:  Stature: Average  Weight:  Weight: Average weight  Clothing:   Clothing: Neat/clean  Grooming:  Grooming: Normal  Cosmetic use:  Cosmetic Use: None  Posture/gait:  Posture/Gait: Tense(pt reports being afraid of falling, so walks with a cane)  Motor activity:  Motor Activity: Slowed  Sensorium  Attention:  Attention: Normal  Concentration:  Concentration: Anxiety interferes  Orientation:  Orientation: Object, Person, Place, Situation, Time, X5  Recall/memory:  Recall/Memory: Normal  Affect and Mood  Affect:  Affect: Anxious  Mood:  Mood: Anxious  Relating  Eye contact:  Eye Contact: Fleeting  Facial expression:  Facial Expression: Anxious  Attitude toward examiner:  Attitude Toward Examiner: Cooperative  Thought and Language  Speech flow: Speech Flow: Normal  Thought content:  Thought Content: Appropriate to mood and circumstances  Preoccupation:  Preoccupations: (N/A)  Hallucinations:  Hallucinations: (N/A)  Organization:     Company secretary of Knowledge:  Fund of Knowledge: Average  Intelligence:  Intelligence: Average  Abstraction:  Abstraction: Normal  Judgement:  Judgement: Normal  Reality Testing:  Reality Testing: Realistic  Insight:  Insight: Good  Decision Making:  Decision Making: Normal  Social Functioning  Social Maturity:  Social Maturity: Isolates  Social Judgement:  Social Judgement: Normal  Stress  Stressors:  Stressors: Work  Coping Ability:  Coping Ability: Exhausted, Resilient("I'm tired of having to be resilient." )  Skill Deficits:     Supports:      Family and Psychosocial History: Family history Marital status: Married Number of Years Married: 2 What types of issues is patient dealing with in the relationship?: "I don't guess anymore than anyone else."  Additional relationship information: "She works hard and means well."  Are you sexually active?: Yes What is your sexual orientation?: Heterosexual Has your sexual activity been affected by  drugs, alcohol, medication, or emotional stress?: "Maybe  sometimes. When she's yeling, I find that unattractive."  Does patient have children?: Yes How many children?: 1 How is patient's relationship with their children?: Stepdaughter, age 39. "She has autism and some other learning disabilities. It's fine, I get along with her. But I don't have any authority with her. She treats me like her mom does."   Childhood History:  Childhood History By whom was/is the patient raised?: Grandparents, Mother Additional childhood history information: "I lived with my mom and grandmother. Mom got married when I was [age], and I stayed with my grandmother to finish high school. I saw my dad every other weekend and spent the summers with him sometime."  Description of patient's relationship with caregiver when they were a child: "My relationship with my mom was alright--she stayed with her boyfriend a lot. She was always there to wake me up in the morning. Everybody in my family drank with the exception of my grandmother. I had a good relationship with her. I had a good relationship with my dad too. He died tow years ago."  Patient's description of current relationship with people who raised him/her: Father passed two years ago, mom passed away three years ago.  How were you disciplined when you got in trouble as a child/adolescent?: "not much often, because I was really good. But, maybe a belt every now and then--but not really that much."  Does patient have siblings?: No Did patient suffer any verbal/emotional/physical/sexual abuse as a child?: No Did patient suffer from severe childhood neglect?: No Has patient ever been sexually abused/assaulted/raped as an adolescent or adult?: No Was the patient ever a victim of a crime or a disaster?: No Witnessed domestic violence?: No Has patient been effected by domestic violence as an adult?: No  CCA Part Two B  Employment/Work Situation: Employment / Work Situation Employment situation: Employed Where is patient  currently employed[employer]. [employer]ney?: [employer]  How long has patient been employed?: since February 26, 2018 Patient's job has been impacted by current illness: Yes Describe how patient's job has been impacted: "Worrying about making calls, avoiding interacting with large groups at work, fear of falling down, fear of something happening."  What is the longest time patient has a held a job?: 5 years 11 months Where was the patient employed at that time?: [employer]  Did You Receive Any Psychiatric Treatment/Services While in the [employer] Yes Type of Psychiatric Treatment/Services in [employer]: Psychiatrist for medication management, therapist, "sat there and talked."  Are There Guns or Other Weapons in Your Home?: No Are These ComptrollerWeapons Safely Secured?: (N/A)  Education: Education School Currently Attending: Yes, at [employer] Last Grade Completed: 12 Name of [employer]: [employer]  Did Garment/textile technologistYou Graduate From [employer] School?: Yes Did Theme park managerYou Attend College?: No Did Designer, television/film setYou Attend Graduate School?: No Did You Have Any Special Interests In School?: "not really."  Did You Have An Individualized Education Program (IIEP): No Did You Have Any Difficulty At [employer]?: No  Religion: Religion/Spirituality Are You A Religious Person?: No  Leisure/Recreation: Leisure / Recreation Leisure and Hobbies: "Right now, I watch TV."   Exercise/Diet: Exercise/Diet Do You Exercise?: No Have You Gained or Lost A Significant Amount of Weight in the Past Six Months?: No Do You Follow a Special Diet?: No Do You Have Any Trouble Sleeping?: No  CCA Part Two C  Alcohol/Drug Use: Alcohol / Drug Use Pain Medications: N/A Prescriptions: Lexapro, Wellbutrin, Busbar  Over the Counter: N/A History  of alcohol / drug use?: Yes(Alcohol and Klonopin ) Longest period of sobriety (when/how long): 15 months (current)  Negative Consequences of Use: Work / Programmer, multimedia, Personal relationships Withdrawal Symptoms: (N/A) Substance #1 Name of  Substance 1: Alcohol ("I was drinking at work in 2017. I lost my job because I was sleeping at work.") 1 - Age of First Use: 14 1 - Amount (size/oz): "I always drank heavily. I think starting in [age], I was drinking on a daily basis."  1 - Frequency: "Daily. Through the years I'd have times when it wasn't as much."  1 - Duration: 21-21 years  1 - Last Use / Amount: July 02, 2018 Substance #2 Name of Substance 2: Klonopin 2 - Age of First Use: 29 2 - Amount (size/oz): 3 per day "I can't remember the mg."  2 - Frequency: Daily 2 - Duration: Three years  2 - Last Use / Amount: Age 60                  CCA Part Three  ASAM's:  Six Dimensions of Multidimensional Assessment  Dimension 1:  Acute Intoxication and/or Withdrawal Potential:     Dimension 2:  Biomedical Conditions and Complications:     Dimension 3:  Emotional, Behavioral, or Cognitive Conditions and Complications:     Dimension 4:  Readiness to Change:     Dimension 5:  Relapse, Continued use, or Continued Problem Potential:  Dimension 5:  Comments: Pt reports sobriety for 15 months.   Dimension 6:  Recovery/Living Environment:      Substance use Disorder (SUD)    Social Function:  Social Functioning Social Maturity: Isolates Social Judgement: Normal  Stress:  Stress Stressors: Work Coping Ability: Exhausted, Resilient("I'm tired of having to be resilient." ) Patient Takes Medications The Way The Doctor Instructed?: Yes Priority Risk: Low Acuity  Risk Assessment- Self-Harm Potential: Risk Assessment For Self-Harm Potential Thoughts of Self-Harm: No current thoughts Method: No plan Availability of Means: No access/NA Additional Information for Self-Harm Potential: (n/a)  Risk Assessment -Dangerous to Others Potential: Risk Assessment For Dangerous to Others Potential Method: No Plan Availability of Means: No access or NA Intent: Vague intent or NA Notification Required: No need or identified  person Additional Comments for Danger to Others Potential: N/A  DSM5 Diagnoses: Patient Active Problem List   Diagnosis Date Noted  . Balance problem 08/24/2018    Patient Centered Plan: Patient is on the following Treatment Plan(s):  Anxiety  Recommendations for Services/Supports/Treatments: Recommendations for Services/Supports/Treatments Recommendations For Services/Supports/Treatments: Medication Management  Treatment Plan Summary:    Referrals to Alternative Service(s): Referred to Alternative Service(s):   Place:   Date:   Time:    Referred to Alternative Service(s):   Place:   Date:   Time:    Referred to Alternative Service(s):   Place:   Date:   Time:    Referred to Alternative Service(s):   Place:   Date:   Time:     Heidi Dach, LCSW

## 2018-08-25 NOTE — Progress Notes (Deleted)
Comprehensive Clinical Assessment (CCA) Note  08/25/2018 Bradley Parks [id number]  Visit Diagnosis:   No diagnosis found.    CCA Part One  Part One has been completed on paper by the patient.  (See scanned document in Chart Review)  CCA Part Two A  Intake/Chief Complaint:     Mental Health Symptoms Depression:     Mania:     Anxiety:      Psychosis:     Trauma:     Obsessions:     Compulsions:     Inattention:     Hyperactivity/Impulsivity:     Oppositional/Defiant Behaviors:     Borderline Personality:     Other Mood/Personality Symptoms:      Mental Status Exam Appearance and self-care  Stature:     Weight:     Clothing:     Grooming:     Cosmetic use:     Posture/gait:     Motor activity:     Sensorium  Attention:     Concentration:     Orientation:     Recall/memory:     Affect and Mood  Affect:     Mood:     Relating  Eye contact:     Facial expression:     Attitude toward examiner:     Thought and Language  Speech flow:    Thought content:     Preoccupation:     Hallucinations:     Organization:     Company secretary of Knowledge:     Intelligence:     Abstraction:     Judgement:     Dance movement psychotherapist:     Insight:     Decision Making:     Social Functioning  Social Maturity:     Social Judgement:     Stress  Stressors:     Coping Ability:     Skill Deficits:     Supports:      Family and Psychosocial History:    Childhood History:     CCA Part Two B  Employment/Work Situation:    Education:    Religion:    Leisure/Recreation:    Exercise/Diet:    CCA Part Two C  Alcohol/Drug Use:                        CCA Part Three  ASAM's:  Six Dimensions of Multidimensional Assessment  Dimension 1:  Acute Intoxication and/or Withdrawal Potential:     Dimension 2:  Biomedical Conditions and Complications:     Dimension 3:  Emotional, Behavioral, or Cognitive Conditions and Complications:     Dimension  4:  Readiness to Change:     Dimension 5:  Relapse, Continued use, or Continued Problem Potential:     Dimension 6:  Recovery/Living Environment:      Substance use Disorder (SUD)    Social Function:     Stress:     Risk Assessment- Self-Harm Potential:    Risk Assessment -Dangerous to Others Potential:    DSM5 Diagnoses: Patient Active Problem List   Diagnosis Date Noted  . Balance problem 08/24/2018    Patient Centered Plan: Patient is on the following Treatment Plan(s):  {CHL AMB BH OP Treatment Plans:[id number]}  Recommendations for Services/Supports/Treatments:    Treatment Plan Summary:    Referrals to Alternative Service(s): Referred to Alternative Service(s):   Place:   Date:   Time:    Referred to Alternative Service(s):   Place:  Date:   Time:    Referred to Alternative Service(s):   Place:   Date:   Time:    Referred to Alternative Service(s):   Place:   Date:   Time:     Heidi DachKelsey Ezekiel Menzer

## 2018-08-25 NOTE — Progress Notes (Deleted)
Bradley Parks a 37 y.o. male patient ***.        Heidi DachKelsey Nahsir Venezia, LCSW

## 2018-09-01 ENCOUNTER — Ambulatory Visit (INDEPENDENT_AMBULATORY_CARE_PROVIDER_SITE_OTHER): Payer: Non-veteran care | Admitting: Licensed Clinical Social Worker

## 2018-09-01 DIAGNOSIS — F411 Generalized anxiety disorder: Secondary | ICD-10-CM

## 2018-09-01 NOTE — Progress Notes (Signed)
   THERAPIST PROGRESS NOTE  Session Time: 10:00-11:00  Participation Level: Active  Behavioral Response: NeatAlertAnxious  Type of Therapy: Individual Therapy  Treatment Goals addressed: Anxiety  Interventions: CBT  Summary: Harrey Centrone is a 37 y.o. male who presents with generalized anxiety as it relates to his physical limitations and interactions with others. Othell reports a history of substance and alcohol use, but has been in recovery for 15 months. Burton is engaged in treatment, and willing to meet weekly to work on emotional regulation and managing anxiety symptoms. Today, we discussed events from the previous week which caused anxiety, which often presents in anger. Rohit discussed his wife encouraging him to get tested for MS, which he reports being apprehensive about due to "not wanting to get excited that it could be something we could fix." Maveryck reports his frustration about his job, and feeling like it is just a means to an end.   Suicidal/Homicidal: Nowithout intent/plan  Therapist Response: I provided Kendell Bane with information on CBT and encouraged him to complete a thought record over the next week. We discussed how to complete a thought record and he expressed understanding and agreement with this information.   Plan: Return again in 1 weeks.  Diagnosis: Axis I: Generalized Anxiety Disorder    Axis II: No diagnosis    Heidi Dach, LCSW 09/01/2018

## 2018-09-07 ENCOUNTER — Ambulatory Visit (INDEPENDENT_AMBULATORY_CARE_PROVIDER_SITE_OTHER): Payer: Non-veteran care | Admitting: Psychiatry

## 2018-09-07 ENCOUNTER — Encounter: Payer: Self-pay | Admitting: Psychiatry

## 2018-09-07 ENCOUNTER — Other Ambulatory Visit: Payer: Self-pay

## 2018-09-07 VITALS — BP 116/77 | HR 61 | Temp 97.5°F | Wt 129.8 lb

## 2018-09-07 DIAGNOSIS — F4010 Social phobia, unspecified: Secondary | ICD-10-CM

## 2018-09-07 DIAGNOSIS — F330 Major depressive disorder, recurrent, mild: Secondary | ICD-10-CM

## 2018-09-07 DIAGNOSIS — F1021 Alcohol dependence, in remission: Secondary | ICD-10-CM | POA: Diagnosis not present

## 2018-09-07 DIAGNOSIS — F1321 Sedative, hypnotic or anxiolytic dependence, in remission: Secondary | ICD-10-CM

## 2018-09-07 DIAGNOSIS — F411 Generalized anxiety disorder: Secondary | ICD-10-CM | POA: Diagnosis not present

## 2018-09-07 MED ORDER — BUSPIRONE HCL 10 MG PO TABS: 10.0000 mg | ORAL_TABLET | Freq: Two times a day (BID) | ORAL | 1 refills | Status: DC

## 2018-09-07 MED ORDER — CITALOPRAM HYDROBROMIDE 10 MG PO TABS: 10.0000 mg | ORAL_TABLET | Freq: Every day | ORAL | 1 refills | Status: DC

## 2018-09-07 NOTE — Progress Notes (Signed)
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  Patient today reports he continues to be anxious and depressed.  He has not noticed a big difference with the addition of Wellbutrin but he would like to stay on the same.  He however reports that he used to be on Celexa and BuSpar combination in the past and did very well on the same.  He would like to go back to Celexa if possible and not take the Lexapro.  He reports sleep is good.  He continues to see our therapist here and has an upcoming appointment tomorrow.  He reports that therapy sessions as helpful.  He is currently working on coping with his anxiety symptoms.  He continues to have tremors, unsteady gait and has a history of falls.  He reports his primary care provider is in the process of referring him to a neurologist for further workup.  He continues to take medications like Mysoline for his tremors.  He uses a cane while he walks.  He denies any suicidality.  He denies any perceptual disturbances.   Visit Diagnosis:    ICD-10-CM   1. GAD (generalized anxiety disorder) F41.1 busPIRone (BUSPAR) 10 MG tablet    DISCONTINUED: busPIRone (BUSPAR) 10 MG tablet  2. MDD (major depressive disorder), recurrent episode, mild (HCC) F33.0 busPIRone (BUSPAR) 10 MG tablet    DISCONTINUED: busPIRone (BUSPAR) 10 MG tablet  3. 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 Social anxiety disorder F40.10 busPIRone (BUSPAR) 10 MG tablet    DISCONTINUED: busPIRone (BUSPAR) 10 MG tablet  5. 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  Past trials of Lexapro, Klonopin, BuSpar.  Past Medical History:  Past Medical History:  Diagnosis Date  . Kidney stones     Past Surgical History:  Procedure Laterality Date  . NOSE SURGERY      Family Psychiatric History: I have reviewed family psychiatric history from my progress note on 08/24/2018.  Family History:  Family History  Problem Relation Age of Onset  . Bipolar disorder Maternal Aunt     Social History: Reviewed social history from my progress note on 08/24/2018. Social History   Socioeconomic History  . Marital status: Married    Spouse name: [name]  . Number of children: Not on file  . Years of education: Not on file  . Highest education level: Some college, no degree  Occupational History  . Not on file  Social Needs  . Financial resource strain: Not hard at all  . Food insecurity:    Worry: Never true    Inability: Never true  . Transportation needs:    Medical: No    Non-medical: No  Tobacco Use  . Smoking status: Former Smoker    Last attempt to quit: 06/24/2017    Years since quitting: 1.2  . Smokeless tobacco: Never Used  Substance and Sexual Activity  . Alcohol use: Not Currently    Comment: occ.  . Drug use: No  . Sexual activity: Yes  Lifestyle  . Physical activity:  Days per week: 0 days    Minutes per session: 0 min  . Stress: Very much  Relationships  . Social connections:    Talks on phone: Not on file    Gets together: Not on file    Attends religious service: Never    Active member of club or organization: No    Attends meetings of clubs or organizations: Never    Relationship status: Married  Other Topics Concern  . Not on file  Social History Narrative  . Not on file    Allergies:  Allergies  Allergen Reactions  . Penicillins     Metabolic Disorder Labs: No results found for: HGBA1C, MPG No results found for: PROLACTIN No results found for: CHOL, TRIG, HDL, CHOLHDL, VLDL,  LDLCALC No results found for: TSH  Therapeutic Level Labs: No results found for: LITHIUM No results found for: VALPROATE No components found for:  CBMZ  Current Medications: Current Outpatient Medications  Medication Sig Dispense Refill  . buPROPion (WELLBUTRIN) 75 MG tablet Take 1 tablet (75 mg total) by mouth daily with breakfast. 30 tablet 1  . busPIRone (BUSPAR) 10 MG tablet Take 1 tablet (10 mg total) by mouth 2 (two) times daily. 60 tablet 1  . primidone (MYSOLINE) 50 MG tablet Take 100 mg by mouth 2 (two) times daily.    . citalopram (CELEXA) 10 MG tablet Take 1-1.5 tablets (10-15 mg total) by mouth daily. Take 1 tablet for 2 weeks and increase to 15 mg . 45 tablet 1   No current facility-administered medications for this visit.      Musculoskeletal: Strength & Muscle Tone: within normal limits Gait & Station: walks with cane Patient leans: N/A  Psychiatric Specialty Exam: Review of Systems  Psychiatric/Behavioral: Positive for depression. The patient is nervous/anxious.   All other systems reviewed and are negative.   Blood pressure 116/77, pulse 61, temperature (!) 97.5 F (36.4 C), temperature source Oral, weight 129 lb 12.8 oz (58.9 kg).Body mass index is 20.33 kg/m.  General Appearance: Casual  Eye Contact:  Fair  Speech:  Normal Rate  Volume:  Normal  Mood:  Anxious and Dysphoric  Affect:  Congruent  Thought Process:  Goal Directed and Descriptions of Associations: Intact  Orientation:  Full (Time, Place, and Person)  Thought Content: Logical   Suicidal Thoughts:  No  Homicidal Thoughts:  No  Memory:  Immediate;   Fair Recent;   Fair Remote;   Fair  Judgement:  Fair  Insight:  Fair  Psychomotor Activity:  Tremor  Concentration:  Concentration: Fair and Attention Span: Fair  Recall:  [employer] of Knowledge: Fair  Language: Fair  Akathisia:  No  Handed:  Right  AIMS (if indicated): na  Assets:  Communication Skills Desire for Improvement Social  Support  ADL's:  Intact  Cognition: WNL  Sleep:  Fair   Screenings:PHQ 9,GAD 7   Assessment and Plan: Keeton is a 37 year old Caucasian male, married, employed, has a history of anxiety, depression, alcohol and benzodiazepine dependence in remission, balance problems, presented to the clinic today for a follow-up visit.  Patient is biologically predisposed given his history of trauma, family history of mental health problems.  Patient also has a history of substance abuse/alcohol and benzodiazepine use disorder in remission. Discussed the following medication readjustment.  He is also interested in genesight  testing.  He will continue psychotherapy visits with Ms. Adelina Mings.  Plan For generalized anxiety disorder Discontinue Lexapro Start Celexa 10 mg for 2  weeks and increase to 15 mg p.o. daily Increase BuSpar to 10 mg p.o. twice daily GAD 7 equals 12 Patient will continue psychotherapy visits.  MDD PHQ 9 equals 10 Continue Wellbutrin 75 mg p.o. daily in the morning Start Celexa. Increase BuSpar as instructed above.  Alcohol use disorder in remission Patient has been sober since the past 1 year or more.  Benzodiazepine dependence in remission Patient continues to be sober and his last use was in 2014.  Patient is interested in genesight testing today. Will refer him for the same.  Pending TSH.  Follow up in clinic in 4 weeks or sooner if needed.  More than 50 % of the time was spent for psychoeducation and supportive psychotherapy and care coordination.  This note was generated in part or whole with voice recognition software. Voice recognition is usually quite accurate but there are transcription errors that can and very often do occur. I apologize for any typographical errors that were not detected and corrected.         [name], MD 09/07/2018, 1:27 PM

## 2018-09-08 ENCOUNTER — Ambulatory Visit (INDEPENDENT_AMBULATORY_CARE_PROVIDER_SITE_OTHER): Payer: Non-veteran care | Admitting: Licensed Clinical Social Worker

## 2018-09-08 ENCOUNTER — Encounter: Payer: Self-pay | Admitting: Licensed Clinical Social Worker

## 2018-09-08 DIAGNOSIS — F411 Generalized anxiety disorder: Secondary | ICD-10-CM

## 2018-09-08 NOTE — Progress Notes (Signed)
   THERAPIST PROGRESS NOTE  Session Time: 1000-1100  Participation Level: Active  Behavioral Response: CasualDrowsyAnxious  Type of Therapy: Individual Therapy  Treatment Goals addressed: Anxiety  Interventions: CBT  Summary: Bradley Parks is a 37 y.o. male who presents with anxiety and PTSD. We are utilizing CBT to address his anxiety and depression symptoms.   Suicidal/Homicidal: Nowithout intent/plan  Therapist Response: Today we discussed the previous week's events. [name] did not work on the thought record I provided him last week. He stated he just did not feel he had time, but he intended to do so. I asked him to work on it in the upcoming week. He discussed feeling behind with school work, and also reported he fell at work last week and subsequently is feeling angry about having to take a drug test at work. Additionally, we discussed the idea of being a "sober drunk," and how he feels he needs to work the [employer] a bit more to process why he was drinking to begin with.   Plan: Return again in 1 weeks.  Diagnosis: Axis I: Generalized Anxiety Disorder    Axis II: No diagnosis    Heidi Dach, LCSW 09/08/2018

## 2018-09-10 ENCOUNTER — Encounter: Payer: Self-pay | Admitting: Emergency Medicine

## 2018-09-10 ENCOUNTER — Other Ambulatory Visit: Payer: Self-pay

## 2018-09-10 ENCOUNTER — Emergency Department
Admission: EM | Admit: 2018-09-10 | Discharge: 2018-09-11 | Disposition: A | Discharge status: Home / Self Care | Payer: Non-veteran care | Attending: Emergency Medicine | Admitting: Emergency Medicine

## 2018-09-10 DIAGNOSIS — W19XXXA Unspecified fall, initial encounter: Secondary | ICD-10-CM | POA: Diagnosis not present

## 2018-09-10 DIAGNOSIS — R251 Tremor, unspecified: Secondary | ICD-10-CM | POA: Insufficient documentation

## 2018-09-10 DIAGNOSIS — Y929 Unspecified place or not applicable: Secondary | ICD-10-CM | POA: Insufficient documentation

## 2018-09-10 DIAGNOSIS — Z87891 Personal history of nicotine dependence: Secondary | ICD-10-CM | POA: Diagnosis not present

## 2018-09-10 DIAGNOSIS — R296 Repeated falls: Secondary | ICD-10-CM | POA: Insufficient documentation

## 2018-09-10 DIAGNOSIS — R51 Headache: Secondary | ICD-10-CM | POA: Insufficient documentation

## 2018-09-10 DIAGNOSIS — Y998 Other external cause status: Secondary | ICD-10-CM | POA: Diagnosis not present

## 2018-09-10 DIAGNOSIS — Y9389 Activity, other specified: Secondary | ICD-10-CM | POA: Insufficient documentation

## 2018-09-10 NOTE — ED Triage Notes (Signed)
Pt reports he fell and hits his head history of falls reports balance off history of tremors. Pt denies loosing consciousness. Pt talks in complete sentences

## 2018-09-10 NOTE — ED Notes (Signed)
Patient c/o tremors and gait issues beginning in his 20's. Patient reports increasing falls the last 4-5 years. Patient was evaluated for these issues by [employer] MD over 10 years ago with no official diagnosis/cause.   Patient currently reports mechanical fall today. Patient c/o headache post fall. Abrasion noted to posterior head. Patient denies LOC and visual changes.

## 2018-09-11 NOTE — ED Notes (Signed)
Reviewed discharge instructions, follow-up care. Patient verbalized understanding of all information reviewed. Patient stable, with no distress noted at this time.    

## 2018-09-11 NOTE — ED Provider Notes (Signed)
[facility] Emergency Department Provider Note   ____________________________________________   First MD Initiated Contact with Patient 09/10/18 2331     (approximate)  I have reviewed the triage vital signs and the nursing notes.   HISTORY  Chief Complaint Fall    HPI Deral Schellenberg is a 37 y.o. male who fell and hit the back of his head.  He did not pass out.  He has a very mild headache where he bumped his head.  He has no nausea double vision blurry vision or any other complaints related to this fall.  He does have a worsening intention tremor that is been getting worse for several years.  He has difficulty with buttoning his shirt and opening doors etc. he has fallen repeatedly.  He and his wife agree that this tremor is getting worse.  Again it is an intention tremor not a resting tremor.  He has prior service in the [employer].   Past Medical History:  Diagnosis Date  . Kidney stones     Patient Active Problem List   Diagnosis Date Noted  . Balance problem 08/24/2018    Past Surgical History:  Procedure Laterality Date  . NOSE SURGERY      Prior to Admission medications   Medication Sig Start Date End Date Taking? Authorizing Provider  buPROPion (WELLBUTRIN) 75 MG tablet Take 1 tablet (75 mg total) by mouth daily with breakfast. 08/24/18   [name], MD  busPIRone (BUSPAR) 10 MG tablet Take 1 tablet (10 mg total) by mouth 2 (two) times daily. 09/07/18   [name], MD  citalopram (CELEXA) 10 MG tablet Take 1-1.5 tablets (10-15 mg total) by mouth daily. Take 1 tablet for 2 weeks and increase to 15 mg . 09/07/18   [name], MD  primidone (MYSOLINE) 50 MG tablet Take 100 mg by mouth 2 (two) times daily.    [provider]    Allergies Penicillins  Family History  Problem Relation Age of Onset  . Bipolar disorder Maternal Aunt     Social History Social History   Tobacco Use  . Smoking status: Former Smoker    Last  attempt to quit: 06/24/2017    Years since quitting: 1.2  . Smokeless tobacco: Never Used  Substance Use Topics  . Alcohol use: Not Currently    Comment: occ.  . Drug use: No    Review of systems Constitutional: No fever/chills Eyes: No visual changes. ENT: No sore throat. Cardiovascular: Denies chest pain. Respiratory: Denies shortness of breath. Gastrointestinal: No abdominal pain.  No nausea, no vomiting.  No diarrhea.  No constipation. Genitourinary: Negative for dysuria. Musculoskeletal: Negative for back pain. Skin: Negative for rash. Neurological: Negative for headaches, focal weakness   ____________________________________________   PHYSICAL EXAM:  VITAL SIGNS: ED Triage Vitals  Enc Vitals Group     BP 09/10/18 2125 138/87     Pulse Rate 09/10/18 2116 (!) 55     Resp 09/10/18 2116 16     Temp 09/10/18 2116 98.4 F (36.9 C)     Temp Source 09/10/18 2116 Oral     SpO2 09/10/18 2116 98 %     Weight 09/10/18 2117 135 lb (61.2 kg)     Height 09/10/18 2117 5\' 7"  (1.702 m)     Head Circumference --      Peak Flow --      Pain Score 09/10/18 2125 3     Pain Loc --  Pain Edu? --      Excl. in GC? --     Constitutional: Alert and oriented. Well appearing and in no acute distress. Eyes: Conjunctivae are normal. PERRL. EOMI. fundi look normal Head: Atraumatic. Nose: No congestion/rhinnorhea. Mouth/Throat: Mucous membranes are moist.  Oropharynx non-erythematous. Neck: No stridor.  No cervical spine tenderness to palpation. Cardiovascular: Normal rate, regular rhythm. Grossly normal heart sounds.  Good peripheral circulation. Respiratory: Normal respiratory effort.  No retractions. Lungs CTAB. Gastrointestinal: Soft and nontender. No distention. No abdominal bruits. No CVA tenderness. Musculoskeletal: No lower extremity tenderness nor edema.  No joint effusions. Neurologic:  Normal speech and language. No gross focal neurologic deficits are appreciated.  Cranial  nerves II through XII are intact of the visual fields were not checked cerebellar finger-to-nose rapid alternating movements and hands are normal there is no evidence of intention tremor while he is doing that.  Motor strength is 5/5 throughout heel-to-shin does demonstrate some hesitancy and tremor.  No reports of numbness. Skin:  Skin is warm, dry and intact. No rash noted. Psychiatric: Mood and affect are normal.  Patient does appear little bit anxious  ____________________________________________   LABS (all labs ordered are listed, but only abnormal results are displayed)  Labs Reviewed - No data to display ____________________________________________  EKG   ____________________________________________  RADIOLOGY  ED MD interpretation:   Official radiology report(s): No results found.  ____________________________________________   PROCEDURES  Procedure(s) performed:   Procedures  Critical Care performed:   ____________________________________________   INITIAL IMPRESSION / ASSESSMENT AND PLAN / ED COURSE  Discussed with patient and his wife that he will need to follow-up with neurology and get some more testing.  It would be useful also to get his records from the [employer] which she will try to obtain he is currently seeing health here at the hospital for help with his anxiety that he is developed.  I will refer him to neurology here to start with.         ____________________________________________   FINAL CLINICAL IMPRESSION(S) / ED DIAGNOSES  Final diagnoses:  Fall, initial encounter     ED Discharge Orders    None       Note:  This document was prepared using Dragon voice recognition software and may include unintentional dictation errors.    Arnaldo NatalMalinda, Gerron Guidotti F, MD 09/11/18 [id number]

## 2018-09-11 NOTE — Discharge Instructions (Addendum)
Please call Dr. Malvin JohnsPotter or Dr. Sherryll BurgerShah on Monday.  Let them know you have been in the ER and were referred to them.  But did not know that Dr. Juliette AlcideMelinda wanted you to's be seen as soon as possible.  They may need to send you to a movement disorder specialist.  Alternatively you could try [facility] or [facility] [place] and see a movement disorder specialist directly.  Please return here if you are worse or for any further problems.

## 2018-09-15 ENCOUNTER — Ambulatory Visit (INDEPENDENT_AMBULATORY_CARE_PROVIDER_SITE_OTHER): Payer: Non-veteran care | Admitting: Licensed Clinical Social Worker

## 2018-09-15 ENCOUNTER — Encounter: Payer: Self-pay | Admitting: Licensed Clinical Social Worker

## 2018-09-15 DIAGNOSIS — F411 Generalized anxiety disorder: Secondary | ICD-10-CM

## 2018-09-15 NOTE — Progress Notes (Signed)
   THERAPIST PROGRESS NOTE  Session Time: 09-1099  Participation Level: Active  Behavioral Response: NeatAlertAnxious  Type of Therapy: Individual Therapy  Treatment Goals addressed: Anxiety  Interventions: CBT  Summary: Maggie Fontroy Eng is a 37 y.o. male who presents with anxiety symptoms. [name] reports he had another fall on 09/10/18 after which he went to the ER. He stated he had fallen earlier in the day, so he was already feeling nervous and ended up falling again. He reports feeling anxious about having neurology tests done again, as he is afraid they will tell him there is no medical reason for his problems around balance. [name] reported feeling exhausted, but reports no SI or HI.    Suicidal/Homicidal: Negativewithout intent/plan  Therapist Response: [name] did not complete the thought record as requested; however, he stated he thought through it "a couple times." During Desmon's session, we utilized CBT to talk through some of his negative responses/thoughts to beginning neurological testing again. He also mentioned the idea of going on short term disability.   Plan: Return again in 1 weeks.  Diagnosis: Axis I: Generalized Anxiety Disorder    Axis II: No diagnosis    Heidi DachKelsey Armin Yerger, LCSW 09/15/2018

## 2018-09-22 ENCOUNTER — Ambulatory Visit: Payer: Non-veteran care | Admitting: Licensed Clinical Social Worker

## 2018-09-28 ENCOUNTER — Ambulatory Visit (INDEPENDENT_AMBULATORY_CARE_PROVIDER_SITE_OTHER): Payer: Non-veteran care | Admitting: Licensed Clinical Social Worker

## 2018-09-28 ENCOUNTER — Encounter: Payer: Self-pay | Admitting: Licensed Clinical Social Worker

## 2018-09-28 DIAGNOSIS — F411 Generalized anxiety disorder: Secondary | ICD-10-CM | POA: Diagnosis not present

## 2018-09-28 NOTE — Progress Notes (Signed)
   THERAPIST PROGRESS NOTE  Session Time: 1000-1100  Participation Level: Active  Behavioral Response: DisheveledLethargicDepressed  Type of Therapy: Individual Therapy  Treatment Goals addressed: Anxiety  Interventions: CBT  Summary: Bradley Parks is a 37 y.o. male who presents with symptoms of anxiety and depression. Bradley Parks arrived thirty minutes late to his appointment, but was seen for the last 30 minutes of his session. He reported having increased anxiety around his physical obstacles, and reports his fear of falling has prevented him from doing a lot lately--such as going on the beach over this past weekend. Bradley Parks and I discussed how to challenge negative thoughts and utilize positive experiences to challenge thoughts. Bradley Parks expressed understanding and agreement with this idea. Bradley Parks continues to be preoccupied with his physical tremor and fear of falling, but states he is seeing a neurologist on October 22nd.    Suicidal/Homicidal: No  Therapist Response: We will continue to utilize CBT to assist Bradley Parks in managing his anxiety and depressive symptoms.   Plan: Return again in 1 weeks.  Diagnosis: Axis I: Generalized Anxiety Disorder    Axis II: No diagnosis    Heidi Dach, LCSW 09/28/2018

## 2018-10-05 ENCOUNTER — Encounter: Payer: Self-pay | Admitting: Licensed Clinical Social Worker

## 2018-10-05 ENCOUNTER — Ambulatory Visit (INDEPENDENT_AMBULATORY_CARE_PROVIDER_SITE_OTHER): Payer: Non-veteran care | Admitting: Licensed Clinical Social Worker

## 2018-10-05 ENCOUNTER — Encounter: Payer: Self-pay | Admitting: Psychiatry

## 2018-10-05 ENCOUNTER — Other Ambulatory Visit: Payer: Self-pay

## 2018-10-05 ENCOUNTER — Ambulatory Visit (INDEPENDENT_AMBULATORY_CARE_PROVIDER_SITE_OTHER): Payer: Non-veteran care | Admitting: Psychiatry

## 2018-10-05 VITALS — BP 143/84 | HR 53 | Temp 97.6°F | Wt 131.0 lb

## 2018-10-05 DIAGNOSIS — F1021 Alcohol dependence, in remission: Secondary | ICD-10-CM | POA: Diagnosis not present

## 2018-10-05 DIAGNOSIS — F411 Generalized anxiety disorder: Secondary | ICD-10-CM

## 2018-10-05 DIAGNOSIS — F1321 Sedative, hypnotic or anxiolytic dependence, in remission: Secondary | ICD-10-CM

## 2018-10-05 DIAGNOSIS — F330 Major depressive disorder, recurrent, mild: Secondary | ICD-10-CM

## 2018-10-05 DIAGNOSIS — F4010 Social phobia, unspecified: Secondary | ICD-10-CM

## 2018-10-05 MED ORDER — CITALOPRAM HYDROBROMIDE 20 MG PO TABS: 20.0000 mg | ORAL_TABLET | Freq: Every day | ORAL | 0 refills | Status: DC

## 2018-10-05 MED ORDER — BUPROPION HCL 75 MG PO TABS: 75.0000 mg | ORAL_TABLET | Freq: Every day | ORAL | 1 refills | Status: DC

## 2018-10-05 NOTE — Progress Notes (Signed)
   THERAPIST PROGRESS NOTE  Session Time: 1000-1100  Participation Level: Active  Behavioral Response: NeatAlertDepressed  Type of Therapy: Individual Therapy  Treatment Goals addressed: Coping  Interventions: CBT  Summary: Bradley Parks is a 37 y.o. male who presents with symptoms of anxiety and depression. Bradley Parks reported feeling angry today--as he had a difficult time when he was dropping his jeep off to be worked on. He reported he got into an argument with one of the employees, and knows he was "being an asshole, but I just don't understand why I have to do everything right and other people don't." We discussed what emotion was motivating his anger--and discussed the idea of anger as a secondary emotion. He reported he likely felt belittled, and frustrated with himself as he was not able to "get his words out right because of his tremor." We discussed his physical health, and how he feels he's invested a lot of hope in his upcoming neurology appointment, "which I really don't want to feel so hopeful about in case there's nothing wrong with me and this is all in my head." We discussed ways we would address his anxiety about falling if, indeed, this is "all in his head." Bradley Parks reported he feels his tremor is the primary root of most other issues in his life and stated, "everything else is managable--it's just this." Bradley Parks became tearful at times during his session while discussing being "tired of living with this tremor." Bradley Parks's appointment with a neurologist is in two weeks, and will likely know more then.   Suicidal/Homicidal: No   Therapist Response: Bradley Parks continues to speak openly about his anxiety rooted in his tremor and difficulty walking/fear of falling. He is able to show insight regarding his behaviors and reactions to things--and how they may relate to various other things in his life. Depending on what news he receives at his neurology appointment, I will discuss moving him to every other  week therapy. Additionally, because Bradley Parks reported having a lot of anger around not being able to speak clearly, I suggested an activity that speech therapists use with children, reading to an animal as it takes the pressure off the person reading. Bradley Parks was in agreement with this plan, and stated he would try it over the next week.   Plan: Return again in 1 week.  Diagnosis: Axis I: Generalized Anxiety Disorder    Axis II: No diagnosis    Heidi Dach, LCSW 10/05/2018

## 2018-10-05 NOTE — Progress Notes (Signed)
BH MD OP Progress Note  10/05/2018 12:34 PM Bradley Parks  MRN:  [id number]  Chief Complaint: ' I am here for follow up.' Chief Complaint    Follow-up; Medication Refill     HPI: Bradley Parks is a 37 year old Caucasian male, married, employed, has a history of depression, anxiety, balance problems, tremors, presented to the clinic today for a follow-up visit.    Lenard reports he continues to be anxious and has low mood on and off.  He reports the Celexa is helping.  He denies any side effects.  He is also compliant on the BuSpar as prescribed.  He continues to struggle with his physical limitation.  He continues to uses a cane to walk.  He recently had a fall and was evaluated in the emergency department.  He was referred to a neurologist and he is currently working with his neurologist.  He reports he continues to have tremors, mostly intentional tremors.  He also has twitching of his facial muscles which he reports as chronic.  He reports sleep is improved.  He continues to work with Adelina Mings his therapist on a weekly basis.  He denies any suicidality.  He denies any homicidality.  He denies any perceptual disturbances.     Visit Diagnosis:    ICD-10-CM   1. GAD (generalized anxiety disorder) F41.1 citalopram (CELEXA) 20 MG tablet  2. MDD (major depressive disorder), recurrent episode, mild (HCC) F33.0 citalopram (CELEXA) 20 MG tablet    buPROPion (WELLBUTRIN) 75 MG tablet  3. Social anxiety disorder F40.10   4. 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  Past trials of Lexapro, Klonopin, BuSpar  Past Medical History:  Past Medical History:  Diagnosis Date  . Kidney stones     Past Surgical History:  Procedure Laterality Date  . NOSE SURGERY      Family Psychiatric History: Have reviewed family psychiatric  history from my progress note on 08/24/2018.  Family History:  Family History  Problem Relation Age of Onset  . Bipolar disorder Maternal Aunt     Social History: I have reviewed social history from my progress note on 08/24/2018 Social History   Socioeconomic History  . Marital status: Married    Spouse name: [name]  . Number of children: Not on file  . Years of education: Not on file  . Highest education level: Some college, no degree  Occupational History  . Not on file  Social Needs  . Financial resource strain: Not hard at all  . Food insecurity:    Worry: Never true    Inability: Never true  . Transportation needs:    Medical: No    Non-medical: No  Tobacco Use  . Smoking status: Former Smoker    Last attempt to quit: 06/24/2017    Years since quitting: 1.2  . Smokeless tobacco: Never Used  Substance and Sexual Activity  . Alcohol use: Not Currently    Comment: occ.  . Drug use: No  . Sexual activity: Yes  Lifestyle  . Physical activity:    Days per week: 0 days    Minutes per session: 0 min  . Stress: Very much  Relationships  . Social connections:    Talks on phone: Not on file    Gets together: Not on file    Attends religious service: Never  Active member of club or organization: No    Attends meetings of clubs or organizations: Never    Relationship status: Married  Other Topics Concern  . Not on file  Social History Narrative  . Not on file    Allergies:  Allergies  Allergen Reactions  . Penicillins     Metabolic Disorder Labs: No results found for: HGBA1C, MPG No results found for: PROLACTIN No results found for: CHOL, TRIG, HDL, CHOLHDL, VLDL, LDLCALC No results found for: TSH  Therapeutic Level Labs: No results found for: LITHIUM No results found for: VALPROATE No components found for:  CBMZ  Current Medications: Current Outpatient Medications  Medication Sig Dispense Refill  . buPROPion (WELLBUTRIN) 75 MG tablet Take 1 tablet  (75 mg total) by mouth daily with breakfast. 30 tablet 1  . busPIRone (BUSPAR) 10 MG tablet Take 1 tablet (10 mg total) by mouth 2 (two) times daily. 60 tablet 1  . primidone (MYSOLINE) 50 MG tablet Take 100 mg by mouth 2 (two) times daily.    . citalopram (CELEXA) 20 MG tablet Take 1 tablet (20 mg total) by mouth daily. 90 tablet 0   No current facility-administered medications for this visit.      Musculoskeletal: Strength & Muscle Tone: within normal limits Gait & Station: uses a cane Patient leans: N/A  Psychiatric Specialty Exam: Review of Systems  All other systems reviewed and are negative.   Blood pressure (!) 143/84, pulse (!) 53, temperature 97.6 F (36.4 C), temperature source Oral, weight 131 lb (59.4 kg).Body mass index is 20.52 kg/m.  General Appearance: Casual  Eye Contact:  Fair  Speech:  Normal Rate  Volume:  Normal  Mood:  Anxious and Dysphoric  Affect:  Congruent  Thought Process:  Goal Directed and Descriptions of Associations: Intact  Orientation:  Full (Time, Place, and Person)  Thought Content: Logical   Suicidal Thoughts:  No  Homicidal Thoughts:  No  Memory:  Immediate;   Fair Recent;   Fair Remote;   Fair  Judgement:  Fair  Insight:  Fair  Psychomotor Activity:  Tremor  Concentration:  Concentration: Fair and Attention Span: Fair  Recall:  [employer] of Knowledge: Fair  Language: Fair  Akathisia:  No  Handed:  Right  AIMS (if indicated): na  Assets:  Communication Skills Desire for Improvement Social Support  ADL's:  Intact  Cognition: WNL  Sleep:  Fair   Screenings:   Assessment and Plan: Bradley Parks is a 37 year old Caucasian male, married, employed, has a history of anxiety, depression, alcohol, benzodiazepine dependence in remission, balance problems, presented to the clinic today for a follow-up visit.  Patient is biologically predisposed given his history of trauma, family history of mental health problems.  He also has a history of  substance abuse/alcohol and benzodiazepine use disorder however is in remission now. Pt continues to struggle with his physical problems as well as has mood symptoms although improving.  Discussed the following medication readjustment with patient.  Plan Generalized anxiety disorder Increase Celexa to 20 mg p.o. daily Continue BuSpar 10 mg p.o. twice daily Continue psychotherapy visits with Adelina Mings, therapist here in clinic.  For MDD Wellbutrin 75 mg p.o. daily in the morning Continue Celexa and BuSpar as prescribed.  For alcohol use disorder in remission Patient continues to be sober since 2018   For benzodiazepine  dependence in remission Patient continues to be sober since 2014  Patient is interested in genesight testing however is waiting for approval from  his health insurance plan.  Patient will contact Shanda Bumps CMA with information.  Patient will continue to work with neurology for his tremors and other neurological problems.  Follow-up in clinic in 4 weeks or sooner if needed.  More than 50 % of the time was spent for psychoeducation and supportive psychotherapy and care coordination.  This note was generated in part or whole with voice recognition software. Voice recognition is usually quite accurate but there are transcription errors that can and very often do occur. I apologize for any typographical errors that were not detected and corrected.              [name], MD 10/05/2018, 12:34 PM

## 2018-10-12 ENCOUNTER — Telehealth: Payer: Self-pay

## 2018-10-12 ENCOUNTER — Ambulatory Visit (INDEPENDENT_AMBULATORY_CARE_PROVIDER_SITE_OTHER): Payer: Non-veteran care | Admitting: Licensed Clinical Social Worker

## 2018-10-12 ENCOUNTER — Encounter: Payer: Self-pay | Admitting: Licensed Clinical Social Worker

## 2018-10-12 DIAGNOSIS — F411 Generalized anxiety disorder: Secondary | ICD-10-CM

## 2018-10-12 NOTE — Telephone Encounter (Signed)
left message for patient to call us back to where he wants his rx's to go too.

## 2018-10-12 NOTE — Progress Notes (Signed)
   THERAPIST PROGRESS NOTE  Session Time: 1000  Participation Level: Active  Behavioral Response: NeatAlertAnxious  Type of Therapy: Individual Therapy  Treatment Goals addressed: Coping  Interventions: Supportive  Summary: Dagmawi Venable is a 37 y.o. male who presents with anxiety symptoms. Irvine reports having a good week and stated he "only had one bad day this week, and I got through it pretty easily." He reported being able to get caught up with school, and we discussed time management and how that would benefit his work/life balance. We also discussed how getting behind can contribute to anxiety, as well as how caffeine can contribute to increased anxiety. Autry expressed understanding with this information. Keshon remains anxious about his neurologist appointment next week, and states he is trying to "not get too hopeful. I know nothing is going to happen at that appointment." Kendell Bane reports feelings of reluctance regarding this appointment, "I just don't want to find out it's all in my head."   Suicidal/Homicidal: No  Therapist Response: Deaunte is responding well to CBT, and how reframing thoughts can change his behaviors/feelings. Ezechiel remains anxious about his upcoming neurologist appointment, but was able to manage his anxiety during the session and discuss ways he can manage anxiety outside this session. Finnegan reports wanting to continue sessions on a weekly basis, as he feels they are helping him.   Plan: Return again in 1 weeks.  Diagnosis: Axis I: Generalized Anxiety Disorder    Axis II: No diagnosis    Heidi Dach, LCSW 10/12/2018

## 2018-10-18 NOTE — Telephone Encounter (Signed)
Called pt again.  Pt states he has appt tomorrow he will just pick up because he can not find the phone number

## 2018-10-19 ENCOUNTER — Encounter: Payer: Self-pay | Admitting: Licensed Clinical Social Worker

## 2018-10-19 ENCOUNTER — Ambulatory Visit (INDEPENDENT_AMBULATORY_CARE_PROVIDER_SITE_OTHER): Payer: Non-veteran care | Admitting: Licensed Clinical Social Worker

## 2018-10-19 DIAGNOSIS — F411 Generalized anxiety disorder: Secondary | ICD-10-CM | POA: Diagnosis not present

## 2018-10-19 NOTE — Progress Notes (Signed)
   THERAPIST PROGRESS NOTE  Session Time: 1000  Participation Level: Active  Behavioral Response: NeatAlertAnxious  Type of Therapy: Individual Therapy  Treatment Goals addressed: Anxiety  Interventions: CBT  Summary: Bradley Parks is a 37 y.o. male who presents with anxiety and depression. [name] reported having a "less than ideal week," because he has felt more shakey. [name] reports having his neurologist appointment this afternoon, which he is trying to remain "indifferent about. But, I'm hoping for a miracle." We discussed how his tremor has affected various aspects of his life, and how he has often thought, "it would be easier if I was just using a walker or wheel chair." [name] reported at work he often has a difficult time "getting his words out smoothly on calls." We discussed this could be related to anxiety, and discussed distraction as a coping skill to improve that symptom. [name] was in agreement with this idea and stated he planned to try using a stress ball while making calls.    Suicidal/Homicidal: No Therapist Response: [name] continues to ruminate about his physical health, but is excited to go to the neurology appointment today. We will continue to utilize CBT to manage [name]'s anxiety and depression, and reframe thoughts to improve behaviors.   Plan: Return again in 1 weeks.  Diagnosis: Axis I: Generalized Anxiety Disorder    Axis II: No diagnosis    Heidi Dach, LCSW 10/19/2018

## 2018-10-26 ENCOUNTER — Telehealth: Payer: Self-pay

## 2018-10-26 ENCOUNTER — Ambulatory Visit (INDEPENDENT_AMBULATORY_CARE_PROVIDER_SITE_OTHER): Payer: Non-veteran care | Admitting: Licensed Clinical Social Worker

## 2018-10-26 ENCOUNTER — Encounter: Payer: Self-pay | Admitting: Licensed Clinical Social Worker

## 2018-10-26 DIAGNOSIS — F4010 Social phobia, unspecified: Secondary | ICD-10-CM

## 2018-10-26 DIAGNOSIS — F330 Major depressive disorder, recurrent, mild: Secondary | ICD-10-CM | POA: Diagnosis not present

## 2018-10-26 DIAGNOSIS — F411 Generalized anxiety disorder: Secondary | ICD-10-CM

## 2018-10-26 MED ORDER — BUSPIRONE HCL 10 MG PO TABS: 10.0000 mg | ORAL_TABLET | Freq: Two times a day (BID) | ORAL | 0 refills | Status: DC

## 2018-10-26 NOTE — Telephone Encounter (Signed)
  pt called states he needs at least a 10 day supply of buspar sent to [facility] states [facility] but it will take 10 days to process.  busPIRone (BUSPAR) 10 MG tablet  Medication  Date: 09/07/2018 Department: [facility] Ordering/Authorizing: [name], MD  Order Providers   Prescribing Provider Encounter Provider  [name], MD [name], MD  Outpatient Medication Detail    Disp Refills Start End   busPIRone (BUSPAR) 10 MG tablet 60 tablet 1 09/07/2018    Sig - Route: Take 1 tablet (10 mg total) by mouth 2 (two) times daily. - Oral   Class: Print

## 2018-10-26 NOTE — Progress Notes (Signed)
   THERAPIST PROGRESS NOTE  Session Time: 1000  Participation Level: Active  Behavioral Response: NeatAlertAnxious  Type of Therapy: Individual Therapy  Treatment Goals addressed: Coping  Interventions: CBT  Summary: Callan Norden is a 37 y.o. male who presents with anxiety and depression. Ermine reported having his neurology appointment, where "they did what I said they were going to do, and I have to come back on the 25th to have 2 different MRIs." We discussed what the doctor said, and how Gabor was feeling about that conversation. We moved on to discussing, if this is not neurological, what could be causing this mentally. Samuell reported when he starts falling feeling helpless and not trying to stop the fall. We discussed the idea of learned helplessness and what that could have stemmed from in Aurelius's life. Dunbar reported when his grandfather died, he began feeling more isolated and detached from others and "had to do things on my own and figure things out on my own." He discussed feelings of "running away," from things when they became difficult. For example: when working [employer], [place] felt judged by other workers and quit his job to join the [employer]. While in the [employer], he avoided making friends due to feeling he would become the "butt of their jokes like I was in high school and then when I worked [employer]." We discussed the idea of detachment and isolation, and how it can contribute to low self-esteem and perhaps a feeling of learned helplessness.   Suicidal/Homicidal: No  Therapist Response: Dessie was much more open and insightful during his session today. He was more willing to accept the idea that his falls could be related to his mental health and agreed to try two things to improve his situation. The first thing was to work out for 10 minutes each day for the next week, and the second thing was to try a distraction technique (listing four things over and over) when he feels himself  becoming nervous about walking from point a to point b. Gilad was in agreement with these ideas, and expressed understanding of the importance of at least attempting each thing.   Plan: Return again in 1 weeks.  Diagnosis: Axis I: Generalized Anxiety Disorder    Axis II: No diagnosis    Heidi Dach, LCSW 10/26/2018

## 2018-10-26 NOTE — Telephone Encounter (Signed)
Sent buspar to [employer]

## 2018-11-02 ENCOUNTER — Ambulatory Visit (INDEPENDENT_AMBULATORY_CARE_PROVIDER_SITE_OTHER): Payer: Non-veteran care | Admitting: Licensed Clinical Social Worker

## 2018-11-02 ENCOUNTER — Ambulatory Visit: Payer: Non-veteran care | Admitting: Psychiatry

## 2018-11-02 ENCOUNTER — Encounter: Payer: Self-pay | Admitting: Licensed Clinical Social Worker

## 2018-11-02 DIAGNOSIS — F330 Major depressive disorder, recurrent, mild: Secondary | ICD-10-CM | POA: Diagnosis not present

## 2018-11-02 DIAGNOSIS — F411 Generalized anxiety disorder: Secondary | ICD-10-CM

## 2018-11-02 NOTE — Progress Notes (Signed)
   THERAPIST PROGRESS NOTE  Session Time: 1000-1030  Participation Level: Minimal  Behavioral Response: CasualDrowsyDepressed  Type of Therapy: Individual Therapy  Treatment Goals addressed: Coping  Interventions: Supportive  Summary: Bradley Parks is a 38 y.o. male who presents with anxiety and depression symptoms. Bradley Parks reported having an uneventful week, "I've just been busy with school and work." He reported completing ten minutes of exercise a day for four out of seven days in the last week as discussed at his last session. He reported he was able to complete the exercise with minimal discomfort. We discussed building on this time/motivation in order to increase his confidence in his mobility. Bradley Parks expressed understanding and agreement with this idea. Bradley Parks reported he fell once in the past week, but did not express any details or further information about the fall. He reports completing his ADA paperwork, "Which gets me a seat at work so I don't have to move around a lot." Bradley Parks expressed happiness about that event. Additionally, Bradley Parks reported "bidding," for a new shift at work, so he would have the weekends off in order to spend more time with his wife.   Suicidal/Homicidal: No  Therapist Response: Bradley Parks reported less depression and anxiety around his physical problems today, and walked into the office without his cane. He seemed guarded today, but was able to discuss things going on in his life when prompted. He stated he did not have a lot to discuss this week, so we ended the session early. We will continue to utilize CBT in order to manage anxiety and depression symptoms.   Plan: Return again in 1 weeks.  Diagnosis: Axis I: Generalized Anxiety Disorder    Axis II: No diagnosis    Heidi Dach, LCSW 11/02/2018

## 2018-11-05 ENCOUNTER — Other Ambulatory Visit: Payer: Self-pay

## 2018-11-05 ENCOUNTER — Ambulatory Visit (INDEPENDENT_AMBULATORY_CARE_PROVIDER_SITE_OTHER): Payer: Non-veteran care | Admitting: Psychiatry

## 2018-11-05 ENCOUNTER — Encounter: Payer: Self-pay | Admitting: Psychiatry

## 2018-11-05 VITALS — BP 120/79 | HR 53 | Temp 97.4°F | Wt 130.6 lb

## 2018-11-05 DIAGNOSIS — F330 Major depressive disorder, recurrent, mild: Secondary | ICD-10-CM

## 2018-11-05 DIAGNOSIS — F411 Generalized anxiety disorder: Secondary | ICD-10-CM

## 2018-11-05 DIAGNOSIS — F1021 Alcohol dependence, in remission: Secondary | ICD-10-CM | POA: Diagnosis not present

## 2018-11-05 DIAGNOSIS — F4010 Social phobia, unspecified: Secondary | ICD-10-CM | POA: Diagnosis not present

## 2018-11-05 DIAGNOSIS — F1321 Sedative, hypnotic or anxiolytic dependence, in remission: Secondary | ICD-10-CM

## 2018-11-05 MED ORDER — BUPROPION HCL 75 MG PO TABS: 75.0000 mg | ORAL_TABLET | Freq: Every day | ORAL | 1 refills | Status: DC

## 2018-11-05 MED ORDER — CITALOPRAM HYDROBROMIDE 20 MG PO TABS: 30.0000 mg | ORAL_TABLET | Freq: Every day | ORAL | 0 refills | Status: DC

## 2018-11-05 NOTE — Progress Notes (Signed)
BH MD OP Progress Note  11/05/2018 12:16 PM Bradley Parks  MRN:  [id number]  Chief Complaint: ' I am here for follow up.' Chief Complaint    Follow-up; Medication Refill     HPI: Bradley Parks is a 37 year old Caucasian male, married, employed, has a history of depression, anxiety, balance problems, tremors, presented to the clinic today for a follow-up visit.  Patient today reports he continues to be distressed about his physical limitations.  He reports he continues to struggle with tremors and muscle twitching and so on.  Patient reports he has been working with his neurologist and currently is on Mysoline.  He reports he does not think it has helped much until now.  He however wants to give it more time.  Patient reports he has been taking the Celexa as prescribed however he does not know if it is helpful with his mood symptoms or not.  The BuSpar however reports that his tremors got worse after being on the BuSpar.  He hence stopped taking it.  Patient however reports his tremors came back again after he stopped the BuSpar and he does not clearly know whether the BuSpar was making it worse or not.  Discussed with patient to try a lower dose of BuSpar at 5 mg twice a day.  Also discussed increasing his Celexa dosage to 30 mg and he agrees with plan.  He will continue to work with Adelina Mings our therapist on a regular basis.  Patient denies any suicidality.  Patient denies any perceptual disturbances.  Patient reports he has been able to function at his work and is currently doing well at school.  Visit Diagnosis:    ICD-10-CM   1. GAD (generalized anxiety disorder) F41.1 citalopram (CELEXA) 20 MG tablet  2. MDD (major depressive disorder), recurrent episode, mild (HCC) F33.0 citalopram (CELEXA) 20 MG tablet    buPROPion (WELLBUTRIN) 75 MG tablet  3. Social anxiety disorder F40.10   4. 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  Past trials of Lexapro, Klonopin, BuSpar  Past Medical History:  Past Medical History:  Diagnosis Date  . Kidney stones     Past Surgical History:  Procedure Laterality Date  . NOSE SURGERY      Family Psychiatric History: Reviewed family psychiatric history from my progress note on 08/24/2018  Family History:  Family History  Problem Relation Age of Onset  . Bipolar disorder Maternal Aunt     Social History: I have reviewed social history from my progress note on 08/24/2018 Social History   Socioeconomic History  . Marital status: Married    Spouse name: Bradley Parks  . Number of children: Not on file  . Years of education: Not on file  . Highest education level: Some college, no degree  Occupational History  . Not on file  Social Needs  . Financial resource strain: Not hard at all  . Food insecurity:    Worry: Never true    Inability: Never true  . Transportation needs:    Medical: No    Non-medical: No  Tobacco Use  . Smoking status: Former Smoker    Last attempt to quit: 06/24/2017    Years since quitting: 1.3  . Smokeless tobacco: Never Used  Substance and Sexual Activity  . Alcohol use: Not Currently    Comment: occ.  Marland Kitchen  Drug use: No  . Sexual activity: Yes  Lifestyle  . Physical activity:    Days per week: 0 days    Minutes per session: 0 min  . Stress: Very much  Relationships  . Social connections:    Talks on phone: Not on file    Gets together: Not on file    Attends religious service: Never    Active member of club or organization: No    Attends meetings of clubs or organizations: Never    Relationship status: Married  Other Topics Concern  . Not on file  Social History Narrative  . Not on file    Allergies:  Allergies  Allergen Reactions  . Penicillins     Metabolic Disorder Labs: No results found for: HGBA1C, MPG No results  found for: PROLACTIN No results found for: CHOL, TRIG, HDL, CHOLHDL, VLDL, LDLCALC No results found for: TSH  Therapeutic Level Labs: No results found for: LITHIUM No results found for: VALPROATE No components found for:  CBMZ  Current Medications: Current Outpatient Medications  Medication Sig Dispense Refill  . buPROPion (WELLBUTRIN) 75 MG tablet Take 1 tablet (75 mg total) by mouth daily with breakfast. 30 tablet 1  . busPIRone (BUSPAR) 10 MG tablet Take 1 tablet (10 mg total) by mouth 2 (two) times daily. 26 tablet 0  . citalopram (CELEXA) 20 MG tablet Take 1.5 tablets (30 mg total) by mouth daily. 135 tablet 0  . primidone (MYSOLINE) 50 MG tablet Take 100 mg by mouth 2 (two) times daily.     No current facility-administered medications for this visit.      Musculoskeletal: Strength & Muscle Tone: within normal limits Gait & Station: walks with cane Patient leans: N/A  Psychiatric Specialty Exam: Review of Systems  Neurological: Positive for tremors.  Psychiatric/Behavioral: Positive for depression. The patient is nervous/anxious.   All other systems reviewed and are negative.   Blood pressure 120/79, pulse (!) 53, temperature (!) 97.4 F (36.3 C), temperature source Oral, weight 130 lb 9.6 oz (59.2 kg).Body mass index is 20.45 kg/m.  General Appearance: Casual  Eye Contact:  Fair  Speech:  Clear and Coherent  Volume:  Normal  Mood:  Dysphoric  Affect:  Congruent  Thought Process:  Goal Directed and Descriptions of Associations: Intact  Orientation:  Full (Time, Place, and Person)  Thought Content: Logical   Suicidal Thoughts:  No  Homicidal Thoughts:  No  Memory:  Immediate;   Fair Recent;   Fair Remote;   Fair  Judgement:  Fair  Insight:  Fair  Psychomotor Activity:  Tremor  Concentration:  Concentration: Fair and Attention Span: Fair  Recall:  [employer] of Knowledge: Fair  Language: Fair  Akathisia:  No  Handed:  Right  AIMS (if indicated): has hx of  chronic tremors  Assets:  Communication Skills Desire for Improvement Social Support  ADL's:  Intact  Cognition: WNL  Sleep:  Fair   Screenings:   Assessment and Plan: [name] is a 38 year old Caucasian male, married, employed, has a history of anxiety, depression, alcohol, benzodiazepine dependence in remission, balance problem, presented to the clinic today for a follow-up visit.  Patient is biologically predisposed given his history of trauma, family history of mental health problems.  He also has a history of substance abuse/alcohol and benzodiazepine use disorder however currently is in remission.  Patient continues to struggle with physical problems as well as mood symptoms.  He reports he continues to struggle with anxiety symptoms  and would like his medications to be readjusted.  He will continue psychotherapy sessions.  Plan as noted below.  Plan GAD Increase Celexa to 30 mg p.o. daily Discussed with patient to start taking a lower dose of BuSpar at 5 mg p.o. twice daily.  He had stopped taking it since he felt it made his tremors worse but he would like to retry it again. Continue psychotherapy visits with our therapist here in clinic.  For MDD Wellbutrin 75 mg p.o. daily in the morning. Continue Celexa and BuSpar.  For alcohol use disorder/benzodiazepine disorder in remission Patient continues to remain sober.  Patient will continue to work with neurology for his tremors.  He is currently on Mysoline we will continue it as prescribed.  Follow-up in clinic in 1 month or sooner if needed  More than 50 % of the time was spent for psychoeducation and supportive psychotherapy and care coordination.  This note was generated in part or whole with voice recognition software. Voice recognition is usually quite accurate but there are transcription errors that can and very often do occur. I apologize for any typographical errors that were not detected and corrected.       [name], MD 11/05/2018, 12:16 PM

## 2018-11-16 ENCOUNTER — Ambulatory Visit (INDEPENDENT_AMBULATORY_CARE_PROVIDER_SITE_OTHER): Payer: Non-veteran care | Admitting: Licensed Clinical Social Worker

## 2018-11-16 ENCOUNTER — Encounter: Payer: Self-pay | Admitting: Licensed Clinical Social Worker

## 2018-11-16 DIAGNOSIS — F411 Generalized anxiety disorder: Secondary | ICD-10-CM

## 2018-11-16 NOTE — Progress Notes (Signed)
   THERAPIST PROGRESS NOTE  Session Time: 1430  Participation Level: Active  Behavioral Response: CasualAlertDepressed  Type of Therapy: Individual Therapy  Treatment Goals addressed: Anxiety  Interventions: CBT  Summary: Bradley Parks is a 37 y.o. male who presents with symptoms related to his diagnosis. Bradley Parks remains fixated on his physical limitations and how they have affected his life. Today, Bradley Parks presented with a flat, depressed affect, and seemed annoyed at times. However, Bradley Parks was able to be redirected. Bradley Parks reported continued frustration at not understanding why he is able to speak comfortably at times (like during therapy) and cannot do so when at work. He also expressed frustration about school, and "when I do everything I'm supposed to do and it doesn't work." This led to a discussion about how that statement applies to his life in general, and specifically his physical issues. Bradley Parks was able to make that connection and expressed understanding. He reported, "when I feel myself getting frustrated like that, what should I do?" We discussed recognizing the signals his body is sending when he begins to get frustrated, and also taking a moment to write it down--which will serve as both distraction and reframing. Bradley Parks was in agreement with these ideas as well. Bradley Parks also reported frustration in feeling like he is unable to affectively communicate with his wife--we discussed ways to be assertive and revisited the idea of I statements. Further, Bradley Parks reported feeling like his pride was hurt--which led to a discussion around what pride meant, and how it was affected. He was able to recognize how to take pride in his actions without focusing primarily on his limitations instead.    Suicidal/Homicidal: No  Therapist Response: Bradley Parks presented with a more depressed affect than normal; however, by the end of the session, Khye's affect was much brighter. Bradley Parks was able to make connections and showed insight  regarding his mental health during his session today. We will continue to utilize CBT moving forward to address both depression and anxiety symptoms.   Plan: Return again in 2 weeks.  Diagnosis: Axis I: Generalized Anxiety Disorder    Axis II: No diagnosis    Heidi DachKelsey Camerin Jimenez, LCSW 11/16/2018

## 2018-11-30 ENCOUNTER — Ambulatory Visit (INDEPENDENT_AMBULATORY_CARE_PROVIDER_SITE_OTHER): Payer: Non-veteran care | Admitting: Licensed Clinical Social Worker

## 2018-11-30 ENCOUNTER — Encounter: Payer: Self-pay | Admitting: Licensed Clinical Social Worker

## 2018-11-30 DIAGNOSIS — F411 Generalized anxiety disorder: Secondary | ICD-10-CM

## 2018-11-30 NOTE — Progress Notes (Signed)
   THERAPIST PROGRESS NOTE  Session Time: 1330  Participation Level: Active  Behavioral Response: NeatAlertAngry  Type of Therapy: Individual Therapy  Treatment Goals addressed: Anxiety  Interventions: CBT  Summary: Bradley Parks is a 37 y.o. male who presents with symptoms related to his diagnosis. Bradley Parks arrived on time for his session, and was in an irritable mood upon arrival. He reported being in a bad mood, "I guess I'm tired. I don't know. I'm just in a bad mood." LCSW pushed [place] to identify what was causing or contributing to his bad mood. Bradley Parks was eventually able to report, "I get home from work late at night, and I'm as quiet as I can and am trying not to make any noise or anything. Then, my wife comes in first thing in the morning and is yelling and turning on lights and wakes me up. I just don't get it." We discussed ways to utilize assertive communication. Bradley Parks reported, "there's no talking to her." We discussed compromise and how to utilize assertive communication in order to improve relationship dynamics. Bradley Parks reported he would attempt to use those skills, however he reported feeling apprehensive. Additionally, Bradley Parks reported having one class to finish things up for, but stated "she hasn't graded a lot of assignments so I don't know where I stand." Bradley Parks was able to understand how assertive communication could help clarify that situation as well.   Suicidal/Homicidal: No  Therapist Response: Bradley Parks was guarded and easily irritated in today's session. He continues to report depressive thoughts and symptoms, and reports feeling his medications aren't working. He denies SI, HI, or AVH. We will continue to utilize CBT moving forward. Bradley Parks reported he will be on vacation during our next session time, so he stated he will come back after first of the year. LCSW was in agreement with this plan.   Plan: Return again in 4 weeks.  Diagnosis: Axis I: Generalized Anxiety Disorder    Axis II: No  diagnosis    Heidi DachKelsey Rosealynn Mateus, LCSW 11/30/2018

## 2018-12-03 ENCOUNTER — Ambulatory Visit (INDEPENDENT_AMBULATORY_CARE_PROVIDER_SITE_OTHER): Payer: Non-veteran care | Admitting: Psychiatry

## 2018-12-03 ENCOUNTER — Encounter: Payer: Self-pay | Admitting: Psychiatry

## 2018-12-03 VITALS — BP 127/83 | HR 52 | Ht 67.0 in | Wt 133.0 lb

## 2018-12-03 DIAGNOSIS — F330 Major depressive disorder, recurrent, mild: Secondary | ICD-10-CM

## 2018-12-03 DIAGNOSIS — F4010 Social phobia, unspecified: Secondary | ICD-10-CM | POA: Diagnosis not present

## 2018-12-03 DIAGNOSIS — F1021 Alcohol dependence, in remission: Secondary | ICD-10-CM

## 2018-12-03 DIAGNOSIS — F1321 Sedative, hypnotic or anxiolytic dependence, in remission: Secondary | ICD-10-CM

## 2018-12-03 DIAGNOSIS — F411 Generalized anxiety disorder: Secondary | ICD-10-CM | POA: Diagnosis not present

## 2018-12-03 MED ORDER — CITALOPRAM HYDROBROMIDE 40 MG PO TABS: 40.0000 mg | ORAL_TABLET | Freq: Every day | ORAL | 0 refills | Status: DC

## 2018-12-03 MED ORDER — TRAZODONE HCL 50 MG PO TABS: 25.0000 mg | ORAL_TABLET | Freq: Every evening | ORAL | 1 refills | Status: DC | PRN

## 2018-12-03 MED ORDER — HYDROXYZINE PAMOATE 25 MG PO CAPS: 25.0000 mg | ORAL_CAPSULE | Freq: Every day | ORAL | 1 refills | Status: DC | PRN

## 2018-12-03 NOTE — Progress Notes (Signed)
BH MD OP Progress Note  12/03/2018 12:20 PM Bradley Parks  MRN:  [id number]  Chief Complaint: ' I am here for follow up.' Chief Complaint    Follow-up     HPI: Bradley Parks is a 37 year old Caucasian male, married, employed, has a history of depression, anxiety, balance problems, tremors, presented to the clinic today for a follow-up visit.  Patient reports his balance issues seems to be worsening the past few days.  Patient reports he does not know what could be making it worse.  He reports he had an MRI done and was told it came back within normal limits.  He continues to work with his neurologist and has upcoming appointments.  He continues to stay compliant on his Mysoline which helps to some extent.  Patient reports he has been taking the Celexa as prescribed.  He is interested in dosage increase.  He reports he had a few episode when he could not answer the phone and had some word finding difficulty.  Patient reports he does not know if it is because he goes into a panic mode or he has some neurological problems going on.  He continues to work with this therapist on a regular basis.  He reports he cannot come in for therapy visits the next few weeks because he is on vacation.  He plans to come back in January.  He reports he stopped the BuSpar because it made his tremors worse.  He however is interested in hydroxyzine as needed today.  Patient also reports sleep as restless.  Discussed adding trazodone as needed for sleep.  Patient denies any suicidality.  Patient denies abusing any illicit drugs or alcohol.   Visit Diagnosis:    ICD-10-CM   1. GAD (generalized anxiety disorder) F41.1   2. MDD (major depressive disorder), recurrent episode, mild (HCC) F33.0   3. Social anxiety disorder F40.10   4. 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  Past trials of Lexapro, Klonopin, BuSpar.  Past Medical History:  Past Medical History:  Diagnosis Date  . Kidney stones     Past Surgical History:  Procedure Laterality Date  . NOSE SURGERY      Family Psychiatric History: Reviewed family psychiatric history from my progress note on 08/24/2018  Family History:  Family History  Problem Relation Age of Onset  . Bipolar disorder Maternal Aunt     Social History: Have reviewed social history from my progress note on 08/24/2018 Social History   Socioeconomic History  . Marital status: Married    Spouse name: [name]  . Number of children: Not on file  . Years of education: Not on file  . Highest education level: Some college, no degree  Occupational History  . Not on file  Social Needs  . Financial resource strain: Not hard at all  . Food insecurity:    Worry: Never true    Inability: Never true  . Transportation needs:    Medical: No    Non-medical: No  Tobacco Use  . Smoking status: Former Smoker    Last attempt to quit: 06/24/2017    Years since quitting: 1.4  . Smokeless tobacco: Never Used  Substance and Sexual Activity  . Alcohol use: Not Currently    Comment: occ.  . Drug use: No  . Sexual activity: Yes  Lifestyle  . Physical activity:    Days per week: 0 days    Minutes per session: 0 min  . Stress: Very much  Relationships  . Social connections:    Talks on phone: Not on file    Gets together: Not on file    Attends religious service: Never    Active member of club or organization: No    Attends meetings of clubs or organizations: Never    Relationship status: Married  Other Topics Concern  . Not on file  Social History Narrative  . Not on file    Allergies:  Allergies  Allergen Reactions  . Penicillins     Metabolic Disorder Labs: No results found for: HGBA1C, MPG No results found for: PROLACTIN No results found for: CHOL, TRIG, HDL, CHOLHDL,  VLDL, LDLCALC No results found for: TSH  Therapeutic Level Labs: No results found for: LITHIUM No results found for: VALPROATE No components found for:  CBMZ  Current Medications: Current Outpatient Medications  Medication Sig Dispense Refill  . buPROPion (WELLBUTRIN) 75 MG tablet Take 1 tablet (75 mg total) by mouth daily with breakfast. 30 tablet 1  . citalopram (CELEXA) 40 MG tablet Take 1 tablet (40 mg total) by mouth daily. 90 tablet 0  . hydrOXYzine (VISTARIL) 25 MG capsule Take 1 capsule (25 mg total) by mouth daily as needed (anxiety). 30 capsule 1  . primidone (MYSOLINE) 50 MG tablet Take 100 mg by mouth 2 (two) times daily.    . traZODone (DESYREL) 50 MG tablet Take 0.5-1 tablets (25-50 mg total) by mouth at bedtime as needed for sleep. 30 tablet 1   No current facility-administered medications for this visit.      Musculoskeletal: Strength & Muscle Tone: within normal limits Gait & Station: unsteady Patient leans: N/A  Psychiatric Specialty Exam: Review of Systems  Neurological: Positive for tremors.  Psychiatric/Behavioral: The patient is nervous/anxious and has insomnia.   All other systems reviewed and are negative.   Blood pressure 127/83, pulse (!) 52, height 5\' 7"  (1.702 m), weight 133 lb (60.3 kg).Body mass index is 20.83 kg/m.  General Appearance: Casual  Eye Contact:  Fair  Speech:  Clear and Coherent  Volume:  Decreased  Mood:  Anxious  Affect:  Congruent  Thought Process:  Goal Directed and Descriptions of Associations: Intact  Orientation:  Full (Time, Place, and Person)  Thought Content: Logical   Suicidal Thoughts:  No  Homicidal Thoughts:  No  Memory:  Immediate;   Fair Recent;   Fair Remote;   Fair  Judgement:  Fair  Insight:  Fair  Psychomotor Activity:  Tremor  Concentration:  Concentration: Fair and Attention Span: Fair  Recall:  FiservFair  Fund of Knowledge: Fair  Language: Fair  Akathisia:  No  Handed:  Right  AIMS (if indicated): hx  of chronic tremors  Assets:  Communication Skills Desire for Improvement  ADL's:  Intact  Cognition: WNL  Sleep:  Poor   Screenings:   Assessment and Plan: Bradley Parks is a 37 year old Caucasian male, married, employed, has a history of anxiety, depression, alcohol and benzodiazepine dependence in remission, balance problems, presented to the clinic today for a follow-up visit.  Patient is biologically predisposed given his history of trauma, family history of mental health problems.  He also has a history of substance abuse/alcohol and benzodiazepine use disorder currently in remission.  Patient continues to struggle with gait instability, tremors as well as sleep problems.  We will continue to make  medication readjustment.  Discussed more intensive psychotherapy sessions.  Plan as noted below.  Plan  GAD Increase Celexa to 40 mg p.o. Daily Discontinue BuSpar for noncompliance. Discussed intensive psychotherapy sessions.  Patient however reports he is going away on vacation and will resume therapy sessions in January. Hydroxyzine 25 mg p.o. daily as needed for severe anxiety attacks.  For MDD Wellbutrin 75 mg p.o. daily Celexa as prescribed Trazodone 25-50 mg p.o. nightly as needed for sleep.  For alcohol use disorder/benzodiazepine use disorder in remission Patient continues to remain sober.  Patient will continue to work with his neurology for his tremors.  He is currently on Mysoline.  Follow-up in clinic in 1 month or sooner if needed  More than 50 % of the time was spent for psychoeducation and supportive psychotherapy and care coordination.  This note was generated in part or whole with voice recognition software. Voice recognition is usually quite accurate but there are transcription errors that can and very often do occur. I apologize for any typographical errors that were not detected and corrected.      [name], MD 12/03/2018, 12:20 PM

## 2018-12-03 NOTE — Patient Instructions (Signed)
Trazodone tablets What is this medicine? TRAZODONE (TRAZ oh done) is used to treat depression. This medicine may be used for other purposes; ask your health care provider or pharmacist if you have questions. COMMON BRAND NAME(S): Desyrel What should I tell my health care provider before I take this medicine? They need to know if you have any of these conditions: -attempted suicide or thinking about it -bipolar disorder -bleeding problems -glaucoma -heart disease, or previous heart attack -irregular heart beat -kidney or liver disease -low levels of sodium in the blood -an unusual or allergic reaction to trazodone, other medicines, foods, dyes or preservatives -pregnant or trying to get pregnant -breast-feeding How should I use this medicine? Take this medicine by mouth with a glass of water. Follow the directions on the prescription label. Take this medicine shortly after a meal or a light snack. Take your medicine at regular intervals. Do not take your medicine more often than directed. Do not stop taking this medicine suddenly except upon the advice of your doctor. Stopping this medicine too quickly may cause serious side effects or your condition may worsen. A special MedGuide will be given to you by the pharmacist with each prescription and refill. Be sure to read this information carefully each time. Talk to your pediatrician regarding the use of this medicine in children. Special care may be needed. Overdosage: If you think you have taken too much of this medicine contact a poison control center or emergency room at once. NOTE: This medicine is only for you. Do not share this medicine with others. What if I miss a dose? If you miss a dose, take it as soon as you can. If it is almost time for your next dose, take only that dose. Do not take double or extra doses. What may interact with this medicine? Do not take this medicine with any of the following medications: -certain medicines  for fungal infections like fluconazole, itraconazole, ketoconazole, posaconazole, voriconazole -cisapride -dofetilide -dronedarone -linezolid -MAOIs like Carbex, Eldepryl, Marplan, Nardil, and Parnate -mesoridazine -methylene blue (injected into a vein) -pimozide -saquinavir -thioridazine -ziprasidone This medicine may also interact with the following medications: -alcohol -antiviral medicines for HIV or AIDS -aspirin and aspirin-like medicines -barbiturates like phenobarbital -certain medicines for blood pressure, heart disease, irregular heart beat -certain medicines for depression, anxiety, or psychotic disturbances -certain medicines for migraine headache like almotriptan, eletriptan, frovatriptan, naratriptan, rizatriptan, sumatriptan, zolmitriptan -certain medicines for seizures like carbamazepine and phenytoin -certain medicines for sleep -certain medicines that treat or prevent blood clots like dalteparin, enoxaparin, warfarin -digoxin -fentanyl -lithium -NSAIDS, medicines for pain and inflammation, like ibuprofen or naproxen -other medicines that prolong the QT interval (cause an abnormal heart rhythm) -rasagiline -supplements like St. John's wort, kava kava, valerian -tramadol -tryptophan This list may not describe all possible interactions. Give your health care provider a list of all the medicines, herbs, non-prescription drugs, or dietary supplements you use. Also tell them if you smoke, drink alcohol, or use illegal drugs. Some items may interact with your medicine. What should I watch for while using this medicine? Tell your doctor if your symptoms do not get better or if they get worse. Visit your doctor or health care professional for regular checks on your progress. Because it may take several weeks to see the full effects of this medicine, it is important to continue your treatment as prescribed by your doctor. Patients and their families should watch out for new  or worsening thoughts of suicide or depression. Also   watch out for sudden changes in feelings such as feeling anxious, agitated, panicky, irritable, hostile, aggressive, impulsive, severely restless, overly excited and hyperactive, or not being able to sleep. If this happens, especially at the beginning of treatment or after a change in dose, call your health care professional. You may get drowsy or dizzy. Do not drive, use machinery, or do anything that needs mental alertness until you know how this medicine affects you. Do not stand or sit up quickly, especially if you are an older patient. This reduces the risk of dizzy or fainting spells. Alcohol may interfere with the effect of this medicine. Avoid alcoholic drinks. This medicine may cause dry eyes and blurred vision. If you wear contact lenses you may feel some discomfort. Lubricating drops may help. See your eye doctor if the problem does not go away or is severe. Your mouth may get dry. Chewing sugarless gum, sucking hard candy and drinking plenty of water may help. Contact your doctor if the problem does not go away or is severe. What side effects may I notice from receiving this medicine? Side effects that you should report to your doctor or health care professional as soon as possible: -allergic reactions like skin rash, itching or hives, swelling of the face, lips, or tongue -elevated mood, decreased need for sleep, racing thoughts, impulsive behavior -confusion -fast, irregular heartbeat -feeling faint or lightheaded, falls -feeling agitated, angry, or irritable -loss of balance or coordination -painful or prolonged erections -restlessness, pacing, inability to keep still -suicidal thoughts or other mood changes -tremors -trouble sleeping -seizures -unusual bleeding or bruising Side effects that usually do not require medical attention (report to your doctor or health care professional if they continue or are bothersome): -change in  sex drive or performance -change in appetite or weight -constipation -headache -muscle aches or pains -nausea This list may not describe all possible side effects. Call your doctor for medical advice about side effects. You may report side effects to FDA at 1-800-FDA-1088. Where should I keep my medicine? Keep out of the reach of children. Store at room temperature between 15 and 30 degrees C (59 to 86 degrees F). Protect from light. Keep container tightly closed. Throw away any unused medicine after the expiration date. NOTE: This sheet is a summary. It may not cover all possible information. If you have questions about this medicine, talk to your doctor, pharmacist, or health care provider.  2018 Elsevier/Gold Standard (2016-05-15 16:57:05)  

## 2018-12-30 ENCOUNTER — Ambulatory Visit: Payer: Non-veteran care | Admitting: Licensed Clinical Social Worker

## 2019-02-22 ENCOUNTER — Encounter: Payer: Self-pay | Admitting: Licensed Clinical Social Worker

## 2019-02-22 ENCOUNTER — Telehealth (HOSPITAL_COMMUNITY): Payer: Self-pay | Admitting: Psychiatry

## 2019-02-22 ENCOUNTER — Telehealth: Payer: Self-pay

## 2019-02-22 ENCOUNTER — Encounter: Payer: Self-pay | Admitting: Psychiatry

## 2019-02-22 ENCOUNTER — Other Ambulatory Visit: Payer: Self-pay

## 2019-02-22 ENCOUNTER — Ambulatory Visit (INDEPENDENT_AMBULATORY_CARE_PROVIDER_SITE_OTHER): Payer: Non-veteran care | Admitting: Licensed Clinical Social Worker

## 2019-02-22 ENCOUNTER — Ambulatory Visit (INDEPENDENT_AMBULATORY_CARE_PROVIDER_SITE_OTHER): Payer: Non-veteran care | Admitting: Psychiatry

## 2019-02-22 VITALS — BP 127/86 | HR 60 | Temp 97.5°F | Wt 128.4 lb

## 2019-02-22 DIAGNOSIS — F1321 Sedative, hypnotic or anxiolytic dependence, in remission: Secondary | ICD-10-CM

## 2019-02-22 DIAGNOSIS — F4010 Social phobia, unspecified: Secondary | ICD-10-CM

## 2019-02-22 DIAGNOSIS — F330 Major depressive disorder, recurrent, mild: Secondary | ICD-10-CM

## 2019-02-22 DIAGNOSIS — F411 Generalized anxiety disorder: Secondary | ICD-10-CM

## 2019-02-22 DIAGNOSIS — F1021 Alcohol dependence, in remission: Secondary | ICD-10-CM | POA: Diagnosis not present

## 2019-02-22 MED ORDER — CITALOPRAM HYDROBROMIDE 40 MG PO TABS: 40.0000 mg | ORAL_TABLET | Freq: Every day | ORAL | 0 refills | Status: DC

## 2019-02-22 MED ORDER — BUPROPION HCL 75 MG PO TABS: 75.0000 mg | ORAL_TABLET | Freq: Every day | ORAL | 1 refills | Status: DC

## 2019-02-22 NOTE — Telephone Encounter (Signed)
D:  Dr. Elna Breslow referred pt to [facility].  A:  Placed call to pt to orient.  Pt informed Clinical research associate that he has [employer].  Informed pt that [facility] doesn't [facility] and he may want to contact them to see.  Pt states if [facility] doesn't cover it, he wouldn't be able to afford the program.  He said he would contact the [place].  Writer recommended [facility] if he decides not to do [facility].  Pt states he would think about it.  Inform Dr. Elna Breslow.  R:  Pt receptive.

## 2019-02-22 NOTE — Telephone Encounter (Signed)
faxed and confirmed rx for wellbutrin 75mg  id # [id number] order # [id number] #90 with 1 refill and celexa 40mg  id # [id number] order # [id number] # 90  with 0 refills

## 2019-02-22 NOTE — Progress Notes (Signed)
BH MD OP Progress Note  02/22/2019 12:48 PM Ethon Wymer  MRN:  [id number]  Chief Complaint: ' I am here for follow up." Chief Complaint    Follow-up; Medication Refill     HPI: Elmus is a 38 yr old male, married, employed, has a history of depression, anxiety, balance problems, tremors likely essential, presented to clinic today for a follow-up visit.  Patient today presented along with his wife [name] . Collateral information was obtained from wife.  Patient today appears to have more trouble walking.  Usually he is able to walk with a cane Today however he appeared to need more help while walking, his wife  had to support  and assist.  Per wife he has some days when he goes through an acute flareup and has more difficulty walking and functioning those days.  Per wife patient has significant balance problems, tremors, inability to walk, there are times when he falls, there are times when he has difficulty speaking.  Patient has been to multiple providers for evaluation of his problems.  He currently goes to a neurologist in the [place].  Per patient as well as wife his last appointment was a month ago.  At that appointment he was told he does not have any neurological problem which could be contributing to his multiple symptoms.  Per patient as well as wife they were told it is likely psychological.  Per patient and wife they were told it may be conversion disorder.  Per wife they would like to apply for disability since its difficult for him to function this way.  He is unable to work due to his problems.  There are days when he cannot pick up a phone and talk since he has speech problems.  Patient today reports he stopped all his medications after his last visit with Clinical research associate.  At that visit patient was advised to return to clinic in a month.  Patient however did not show up for almost 3 months.  Patient reports he stopped the Celexa as well as the Wellbutrin since he felt nothing was helpful.  Patient  reports he struggles with lack of motivation, low energy, irritability, mood swings and so on.   Discussed either restarting his medications or adding a mood stabilizer.  Discussed with patient that he also needs to restart psychotherapy sessions and may need to be seen very frequently.  Discussed IOP with patient again.  Patient agrees to be restarted on his Celexa and Wellbutrin.  He will return to clinic in 2 weeks.  We will refer him to IOP again today.  Patient has an appointment with our therapist for individual therapy sessions today. Visit Diagnosis:    ICD-10-CM   1. GAD (generalized anxiety disorder) F41.1   2. MDD (major depressive disorder), recurrent episode, mild (HCC) F33.0 buPROPion (WELLBUTRIN) 75 MG tablet  3. Social anxiety disorder F40.10   4. 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  Past Medical History:  Past Medical History:  Diagnosis Date  . Kidney stones     Past Surgical History:  Procedure Laterality Date  . NOSE SURGERY      Family Psychiatric History: Reviewed family psychiatric history from my progress note on 08/24/2018.  Family History:  Family History  Problem Relation Age of Onset  . Bipolar disorder Maternal Aunt  Social History: Reviewed social history from my progress note on 08/24/2018. Social History   Socioeconomic History  . Marital status: Married    Spouse name: [name]  . Number of children: Not on file  . Years of education: Not on file  . Highest education level: Some college, no degree  Occupational History  . Not on file  Social Needs  . Financial resource strain: Not hard at all  . Food insecurity:    Worry: Never true    Inability: Never true  . Transportation needs:    Medical: No    Non-medical: No  Tobacco Use  . Smoking  status: Former Smoker    Last attempt to quit: 06/24/2017    Years since quitting: 1.6  . Smokeless tobacco: Never Used  Substance and Sexual Activity  . Alcohol use: Not Currently    Comment: occ.  . Drug use: No  . Sexual activity: Yes  Lifestyle  . Physical activity:    Days per week: 0 days    Minutes per session: 0 min  . Stress: Very much  Relationships  . Social connections:    Talks on phone: Not on file    Gets together: Not on file    Attends religious service: Never    Active member of club or organization: No    Attends meetings of clubs or organizations: Never    Relationship status: Married  Other Topics Concern  . Not on file  Social History Narrative  . Not on file    Allergies:  Allergies  Allergen Reactions  . Penicillins     Metabolic Disorder Labs: No results found for: HGBA1C, MPG No results found for: PROLACTIN No results found for: CHOL, TRIG, HDL, CHOLHDL, VLDL, LDLCALC No results found for: TSH  Therapeutic Level Labs: No results found for: LITHIUM No results found for: VALPROATE No components found for:  CBMZ  Current Medications: Current Outpatient Medications  Medication Sig Dispense Refill  . buPROPion (WELLBUTRIN) 75 MG tablet Take 1 tablet (75 mg total) by mouth daily with breakfast. 90 tablet 1  . citalopram (CELEXA) 40 MG tablet Take 1 tablet (40 mg total) by mouth daily. 90 tablet 0  . hydrOXYzine (VISTARIL) 25 MG capsule Take 1 capsule (25 mg total) by mouth daily as needed (anxiety). 30 capsule 1  . primidone (MYSOLINE) 50 MG tablet Take 100 mg by mouth 2 (two) times daily.    . traZODone (DESYREL) 50 MG tablet Take 0.5-1 tablets (25-50 mg total) by mouth at bedtime as needed for sleep. 30 tablet 1   No current facility-administered medications for this visit.      Musculoskeletal: Strength & Muscle Tone: within normal limits Gait & Station: walks with a cane Patient leans: N/A  Psychiatric Specialty Exam: Review of  Systems  Constitutional: Positive for malaise/fatigue.  Neurological: Positive for tremors.  Psychiatric/Behavioral: Positive for depression. The patient is nervous/anxious.   All other systems reviewed and are negative.   Blood pressure 127/86, pulse 60, temperature (!) 97.5 F (36.4 C), temperature source Oral, weight 128 lb 6.4 oz (58.2 kg).Body mass index is 20.11 kg/m.  General Appearance: Casual  Eye Contact:  Fair  Speech:  Clear and Coherent  Volume:  Normal  Mood:  Dysphoric and Irritable  Affect:  Congruent  Thought Process:  Goal Directed and Descriptions of Associations: Intact  Orientation:  Full (Time, Place, and Person)  Thought Content: Logical   Suicidal Thoughts:  No  Homicidal Thoughts:  No  Memory:  Immediate;   Fair Recent;   Fair Remote;   Fair  Judgement:  Fair  Insight:  Fair  Psychomotor Activity:  Normal  Concentration:  Concentration: Fair and Attention Span: Fair  Recall:  [employer] of Knowledge: Fair  Language: Fair  Akathisia:  No  Handed:  Right  AIMS (if indicated): denies tremors, rigidity  Assets:  Desire for Improvement Social Support  ADL's:  Intact  Cognition: WNL  Sleep:  excessive sleep   Screenings:   Assessment and Plan: [place] is a 38 year old Caucasian male, married, employed, has a history of depression, anxiety, alcohol and benzodiazepine dependence in remission, balance problems, tremors, presented to clinic today for a follow-up visit.  Patient is biologically predisposed given his history of trauma, family history of mental health problems.  He also has a history of substance abuse/alcohol and benzodiazepine use disorder in remission.  Patient continues to struggle with neurological symptoms like gait instability tremors and so on and reportedly was told by her neurologist that it is likely conversion disorder.  Patient however with noncompliance to medications as well as therapy visits.   Discussed plan as noted  below.  Plan GAD- unstable Restart Celexa 40 mg p.o. daily Continue psychotherapy sessions.  He has therapy session with Ms. Heidi Dach here in clinic today. Continue hydroxyzine 25 mg p.o. daily as needed for anxiety attacks  For MDD-unstable Restart Wellbutrin 75 mg p.o. daily Trazodone 25 to 50 mg p.o. nightly as needed Celexa as prescribed  For alcohol use/benzodiazepine use disorder in remission Patient continues to remain sober.  Provided patient medication education.  Patient reports he stopped all his medications since he continued to have neurological symptoms.  He also reports he was told by his neurologist he may have conversion disorder.  Discussed with patient that his medications may help with his anxiety and depressive symptoms however he needs psychotherapy sessions if he has conversion disorder or functional neurological symptom disorder.  Discussed with patient that he has been noncompliant with not only his medications however also with his therapy visits.  Discussed with him he can be referred for intensive outpatient program.  Also discussed with him that in order to have a diagnosis of functional neurological symptom disorder writer need to obtain medical records from his providers including his neurologist.  All medical problems or diagnoses has to be ruled out prior to a diagnosis of functional neurological symptom disorder.  Have obtained collateral information from wife-Jaye , as summarized above  Patient agrees to request medical records from his neurologist.  We will refer him to [facility] outpatient clinic for intensive outpatient program.  In the meantime he will continue psychotherapy sessions with Ms. Heidi Dach.  Discussed with patient to return to clinic in 2 weeks or sooner if needed.  I have spent atleast 25 minutes  face to face with patient today. More than 50 % of the time was spent for psychoeducation and supportive psychotherapy and  care coordination.  This note was generated in part or whole with voice recognition software. Voice recognition is usually quite accurate but there are transcription errors that can and very often do occur. I apologize for any typographical errors that were not detected and corrected.       [name], MD 02/22/2019, 12:48 PM

## 2019-02-22 NOTE — Progress Notes (Signed)
   THERAPIST PROGRESS NOTE  Session Time: 1000-1048  Participation Level: Active  Behavioral Response: CasualAlertAnxious, Depressed and Irritable  Type of Therapy: Individual Therapy  Treatment Goals addressed: Coping  Interventions: CBT  Summary: Latravion Schmalzried is a 38 y.o. male who presents with continued symptoms of his diagnosis. Elgene was joined by his wife for the first ten minutes of his session. Aryaman's wife reports they have completed all medical testing for Aleric's symptoms, and have been told "it's all in his head. But, he's calling out of work because he can't walk right, and he's falling down all the time. So, we're applying for disability." LCSW encouraged pt and his wife to look up EMDR, and if they were interested in EMDR, we could begin that modality for treatment moving forward. After pt's wife left the room, Marin reported feeling angry "all the time." Sayvion also reported he stopped taking all medications in December because, "they weren't doing shit anyway." LCSW explained that, it appeared they were doing something since his mood has been affected so drastically. Jacky was able to agree with this point and reported he planned to go back on the medication. However, he reported feeling frustrated that he is not able to function at a "normal" level regarding his tremor and balance. We discussed core beliefs he may hold about himself that could contribute to his desire to not be able to "function normally." Dinh was not able to articulate any core beliefs at this time, but stated he would think about it. He reported being frustrated with his job and "hating working at a call center." Later in the conversation, he reported feeling angry that he wouldn't be able to get a promotion at his job due to his balance issues. LCSW highlighted the fact that he previously stated he did not want to work for that company to begin with, so why would not being able to get a promotion be such a bad thing?  Granvill was able to understand he was utilizing that as evidence to support his negative view and negative thoughts. LCSW encouraged Fredderick to start trying to find a positive way of framing things in every situation moving forward, and asked him to research EMDR before our next session. Farrel expressed understanding and agreement.   Suicidal/Homicidal: No  Therapist Response: Alphus continues to work towards his tx goals but has not yet reached them. Moving forward, we will continue to utilize CBT and possibly EMDR to address past trauma and negative thoughts that impact Akshar's daily functioning.   Plan: Return again in 2 weeks.  Diagnosis: Axis I: Generalized Anxiety Disorder    Axis II: No diagnosis    Heidi Dach, LCSW 02/22/2019

## 2019-02-24 ENCOUNTER — Emergency Department
Admission: EM | Admit: 2019-02-24 | Discharge: 2019-02-24 | Disposition: A | Discharge status: Home / Self Care | Payer: Non-veteran care | Attending: Student in an Organized Health Care Education/Training Program | Admitting: Student in an Organized Health Care Education/Training Program

## 2019-02-24 ENCOUNTER — Other Ambulatory Visit: Payer: Self-pay

## 2019-02-24 ENCOUNTER — Emergency Department: Payer: Non-veteran care

## 2019-02-24 ENCOUNTER — Encounter: Payer: Self-pay | Admitting: Emergency Medicine

## 2019-02-24 DIAGNOSIS — S62617A Displaced fracture of proximal phalanx of left little finger, initial encounter for closed fracture: Secondary | ICD-10-CM | POA: Insufficient documentation

## 2019-02-24 DIAGNOSIS — Z87891 Personal history of nicotine dependence: Secondary | ICD-10-CM | POA: Diagnosis not present

## 2019-02-24 DIAGNOSIS — Y939 Activity, unspecified: Secondary | ICD-10-CM | POA: Diagnosis not present

## 2019-02-24 DIAGNOSIS — Z79899 Other long term (current) drug therapy: Secondary | ICD-10-CM | POA: Insufficient documentation

## 2019-02-24 DIAGNOSIS — Y999 Unspecified external cause status: Secondary | ICD-10-CM | POA: Insufficient documentation

## 2019-02-24 DIAGNOSIS — Y929 Unspecified place or not applicable: Secondary | ICD-10-CM | POA: Insufficient documentation

## 2019-02-24 DIAGNOSIS — S6992XA Unspecified injury of left wrist, hand and finger(s), initial encounter: Secondary | ICD-10-CM | POA: Diagnosis present

## 2019-02-24 DIAGNOSIS — W19XXXA Unspecified fall, initial encounter: Secondary | ICD-10-CM | POA: Insufficient documentation

## 2019-02-24 DIAGNOSIS — W231XXA Caught, crushed, jammed, or pinched between stationary objects, initial encounter: Secondary | ICD-10-CM | POA: Insufficient documentation

## 2019-02-24 MED ORDER — IBUPROFEN 200 MG PO TABS: 400.0000 mg | ORAL_TABLET | Freq: Four times a day (QID) | ORAL | 0 refills | Status: DC | PRN

## 2019-02-24 MED ORDER — LIDOCAINE HCL (PF) 1 % IJ SOLN
5.0000 mL | Freq: Once | INTRAMUSCULAR | Status: AC
  Administered 2019-02-24: 5 mL via INTRADERMAL
  Filled 2019-02-24: qty 5

## 2019-02-24 NOTE — ED Triage Notes (Signed)
Pt arrives with concerns over a possible broken left pinky finger from mechanical fall.

## 2019-02-24 NOTE — ED Provider Notes (Signed)
[facility] Emergency Department Provider Note  ____________________________________________  Time seen: Approximately 1:37 PM  I have reviewed the triage vital signs and the nursing notes.   HISTORY  Chief Complaint Finger Injury    HPI Bradley Parks is a 38 y.o. male presents emergency department for evaluation of left little finger pain after falling today.  Patient states his finger got caught on the chair.   Past Medical History:  Diagnosis Date  . Kidney stones     Patient Active Problem List   Diagnosis Date Noted  . GAD (generalized anxiety disorder) 10/05/2018  . Balance problem 08/24/2018    Past Surgical History:  Procedure Laterality Date  . NOSE SURGERY      Prior to Admission medications   Medication Sig Start Date End Date Taking? Authorizing Provider  buPROPion (WELLBUTRIN) 75 MG tablet Take 1 tablet (75 mg total) by mouth daily with breakfast. 02/22/19   [name], MD  citalopram (CELEXA) 40 MG tablet Take 1 tablet (40 mg total) by mouth daily. 02/22/19   [name], MD  hydrOXYzine (VISTARIL) 25 MG capsule Take 1 capsule (25 mg total) by mouth daily as needed (anxiety). 12/03/18   [name], MD  ibuprofen (MOTRIN IB) 200 MG tablet Take 2 tablets (400 mg total) by mouth every 6 (six) hours as needed. 02/24/19   Enid Derry, PA-C  primidone (MYSOLINE) 50 MG tablet Take 100 mg by mouth 2 (two) times daily.    [provider]  traZODone (DESYREL) 50 MG tablet Take 0.5-1 tablets (25-50 mg total) by mouth at bedtime as needed for sleep. 12/03/18   [name], MD    Allergies Penicillins  Family History  Problem Relation Age of Onset  . Bipolar disorder Maternal Aunt     Social History Social History   Tobacco Use  . Smoking status: Former Smoker    Last attempt to quit: 06/24/2017    Years since quitting: 1.6  . Smokeless tobacco: Never Used  Substance Use Topics  . Alcohol use: Not Currently     Comment: occ.  . Drug use: No     Review of Systems  Respiratory: No SOB. Gastrointestinal: No abdominal pain.  No nausea, no vomiting.  Musculoskeletal: Positive for finger pain. Skin: Negative for rash, abrasions, lacerations.  Positive for swelling and ecchymosis. Neurological: Negative for numbness or tingling   ____________________________________________   PHYSICAL EXAM:  VITAL SIGNS: ED Triage Vitals [02/24/19 1221]  Enc Vitals Group     BP 115/71     Pulse Rate (!) 56     Resp 18     Temp 98.3 F (36.8 C)     Temp Source Oral     SpO2 95 %     Weight 133 lb (60.3 kg)     Height 5\' 8"  (1.727 m)     Head Circumference      Peak Flow      Pain Score 3     Pain Loc      Pain Edu?      Excl. in GC?      Constitutional: Alert and oriented. Well appearing and in no acute distress. Eyes: Conjunctivae are normal. PERRL. EOMI. Head: Atraumatic. ENT:      Ears:      Nose: No congestion/rhinnorhea.      Mouth/Throat: Mucous membranes are moist.  Neck: No stridor.   Cardiovascular: Normal rate, regular rhythm.  Good peripheral circulation.  Cap refill less than 3 seconds.  Respiratory: Normal respiratory effort without tachypnea or retractions. Lungs CTAB. Good air entry to the bases with no decreased or absent breath sounds. Musculoskeletal: Full range of motion to all extremities. No gross deformities appreciated.  Swelling and ecchymosis to left proximal little finger. Neurologic:  Normal speech and language. No gross focal neurologic deficits are appreciated.  Skin:  Skin is warm, dry and intact. No rash noted. Psychiatric: Mood and affect are normal. Speech and behavior are normal. Patient exhibits appropriate insight and judgement.   ____________________________________________   LABS (all labs ordered are listed, but only abnormal results are displayed)  Labs Reviewed - No data to  display ____________________________________________  EKG   ____________________________________________  RADIOLOGY Lexine BatonI, Keisa Blow, personally viewed and evaluated these images (plain radiographs) as part of my medical decision making, as well as reviewing the written report by the radiologist.  Dg Finger Little Left  Result Date: 02/24/2019 CLINICAL DATA:  Post reduction pinky finger fracture. EXAM: LEFT LITTLE FINGER 2+V COMPARISON:  02/24/2019 FINDINGS: Two views study shows marked improvement in alignment of the fracture involving the base of the pinky finger proximal phalanx. Fracture fragment alignment is near anatomic. IMPRESSION: Improved bony alignment post reduction. Electronically Signed   By: Kennith CenterEric  Mansell M.D.   On: 02/24/2019 15:17   Dg Finger Little Left  Result Date: 02/24/2019 CLINICAL DATA:  Presents s/p fall states he landed on left hand. States he noticed his left 5th finger was dislocated States his finger is in better position Has some swelling and bruising to lateral left hand EXAM: LEFT LITTLE FINGER 2+V COMPARISON:  None. FINDINGS: There is a transverse fracture across the base of the proximal phalanx of the left fifth finger. Fractures non comminuted. There is 2 mm of radial displacement of the distal fracture component. Fracture does not involve the PIP articular surface. No other fractures. Joints are normally aligned. Soft tissue swelling is noted of the proximal fifth finger. IMPRESSION: Transverse, non comminuted fracture at the base of the proximal phalanx the left fifth finger with 2 mm of radial displacement. Electronically Signed   By: Amie Portlandavid  Ormond M.D.   On: 02/24/2019 13:30    ____________________________________________    PROCEDURES  Procedure(s) performed:    .Ortho Injury Treatment Date/Time: 02/24/2019 3:48 PM Performed by: Enid DerryWagner, Karmin Kasprzak, PA-C Authorized by: Enid DerryWagner, Ory Elting, PA-C   Consent:    Consent obtained:  Verbal   Consent given  by:  Patient   Risks discussed:  Fracture, nerve damage, stiffness, irreducible dislocation and recurrent dislocation   Alternatives discussed:  No treatment, alternative treatment, immobilization, referral and delayed treatmentInjury location: finger Location details: left little finger Injury type: fracture Fracture type: proximal phalanx MCP joint involved: no IP joint involved: no Pre-procedure neurovascular assessment: neurovascularly intact Pre-procedure distal perfusion: normal Pre-procedure neurological function: normal Pre-procedure range of motion: reduced Manipulation performed: yes Reduction successful: yes X-ray confirmed reduction: yes Immobilization: tape and splint Splint type: ulnar gutter Supplies used: Ortho-Glass and cotton padding Post-procedure neurovascular assessment: post-procedure neurovascularly intact Post-procedure distal perfusion: normal Post-procedure neurological function: normal Post-procedure range of motion: unchanged Patient tolerance: Patient tolerated the procedure well with no immediate complications       Medications  lidocaine (PF) (XYLOCAINE) 1 % injection 5 mL (has no administration in time range)     ____________________________________________   INITIAL IMPRESSION / ASSESSMENT AND PLAN / ED COURSE  Pertinent labs & imaging results that were available during my care of the patient were reviewed by me and considered in  my medical decision making (see chart for details).  Review of the [place] CSRS was performed in accordance of the [facility] prior to dispensing any controlled drugs.  Patient's diagnosis is consistent with proximal phalanx fracture. Vital signs and exam are reassuring. Fracture was reduced in the emergency department. Splint was placed. Patient will be discharged home with prescriptions for ibuprofen. Patient is to follow up with othro as directed. Patient is given ED precautions to return to the ED for any worsening or new  symptoms.     ____________________________________________  FINAL CLINICAL IMPRESSION(S) / ED DIAGNOSES  Final diagnoses:  Closed displaced fracture of proximal phalanx of left little finger, initial encounter      NEW MEDICATIONS STARTED DURING THIS VISIT:  ED Discharge Orders         Ordered    ibuprofen (MOTRIN IB) 200 MG tablet  Every 6 hours PRN     02/24/19 1528              This chart was dictated using voice recognition software/Dragon. Despite best efforts to proofread, errors can occur which can change the meaning. Any change was purely unintentional.    Enid Derry, PA-C 02/24/19 1747    Sharman Cheek, MD 02/25/19 [phone]

## 2019-02-24 NOTE — Discharge Instructions (Signed)
Please wear splint until evaluated by ortho.

## 2019-02-24 NOTE — ED Notes (Signed)
See triage note  Presents s/p fall  states he landed on left hand.  States he noticed his left 5th finger was dislocated    States his finger is in better position   Has some swelling and bruising to lateral left hand

## 2019-02-24 NOTE — ED Notes (Signed)
Pt discharged home after verbalizing understanding of discharge instructions; nad noted. 

## 2019-02-27 ENCOUNTER — Emergency Department: Payer: Non-veteran care

## 2019-02-27 ENCOUNTER — Other Ambulatory Visit: Payer: Self-pay

## 2019-02-27 ENCOUNTER — Emergency Department
Admission: EM | Admit: 2019-02-27 | Discharge: 2019-02-27 | Disposition: A | Discharge status: Home / Self Care | Payer: Non-veteran care | Attending: Emergency Medicine | Admitting: Emergency Medicine

## 2019-02-27 DIAGNOSIS — Z4789 Encounter for other orthopedic aftercare: Secondary | ICD-10-CM | POA: Diagnosis not present

## 2019-02-27 DIAGNOSIS — X58XXXD Exposure to other specified factors, subsequent encounter: Secondary | ICD-10-CM | POA: Diagnosis not present

## 2019-02-27 DIAGNOSIS — Z79899 Other long term (current) drug therapy: Secondary | ICD-10-CM | POA: Insufficient documentation

## 2019-02-27 DIAGNOSIS — S62647D Nondisplaced fracture of proximal phalanx of left little finger, subsequent encounter for fracture with routine healing: Secondary | ICD-10-CM | POA: Diagnosis not present

## 2019-02-27 NOTE — ED Provider Notes (Signed)
[facility] Emergency Department Provider Note  ____________________________________________  Time seen: Approximately 10:48 PM  I have reviewed the triage vital signs and the nursing notes.   HISTORY  Chief Complaint Wound Check    HPI Bradley Parks is a 38 y.o. male who presents emergency department complaining of bruising to the left hand.  Patient reports that he had an injury that was evaluated in the emergency department for.  Patient had a displaced fracture with reduction of the fifth digit.  Patient was in an ulnar gutter splint and noticed ecchymosis to the third digit as well as bruising to the fifth digit.  Patient was concerned that there may be a complication and presents emergency department.  He denies any numbness or tingling.  He denies any increase in pain.  Patient has not taken any medications for his complaint prior to arrival.  No other injury or complaint at this time.    Past Medical History:  Diagnosis Date  . Kidney stones     Patient Active Problem List   Diagnosis Date Noted  . GAD (generalized anxiety disorder) 10/05/2018  . Balance problem 08/24/2018    Past Surgical History:  Procedure Laterality Date  . NOSE SURGERY      Prior to Admission medications   Medication Sig Start Date End Date Taking? Authorizing Provider  buPROPion (WELLBUTRIN) 75 MG tablet Take 1 tablet (75 mg total) by mouth daily with breakfast. 02/22/19   [name], MD  citalopram (CELEXA) 40 MG tablet Take 1 tablet (40 mg total) by mouth daily. 02/22/19   [name], MD  hydrOXYzine (VISTARIL) 25 MG capsule Take 1 capsule (25 mg total) by mouth daily as needed (anxiety). 12/03/18   [name], MD  ibuprofen (MOTRIN IB) 200 MG tablet Take 2 tablets (400 mg total) by mouth every 6 (six) hours as needed. 02/24/19   Enid Derry, PA-C  primidone (MYSOLINE) 50 MG tablet Take 100 mg by mouth 2 (two) times daily.    [provider]   traZODone (DESYREL) 50 MG tablet Take 0.5-1 tablets (25-50 mg total) by mouth at bedtime as needed for sleep. 12/03/18   [name], MD    Allergies Penicillins  Family History  Problem Relation Age of Onset  . Bipolar disorder Maternal Aunt     Social History Social History   Tobacco Use  . Smoking status: Former Smoker    Last attempt to quit: 06/24/2017    Years since quitting: 1.6  . Smokeless tobacco: Never Used  Substance Use Topics  . Alcohol use: Not Currently    Comment: occ.  . Drug use: No     Review of Systems  Constitutional: No fever/chills Eyes: No visual changes.  Cardiovascular: no chest pain. Respiratory: no cough. No SOB. Gastrointestinal: No abdominal pain.  No nausea, no vomiting.   Musculoskeletal: Positive for bruising to the left hand, specifically the fifth and third digit. Skin: Negative for rash, abrasions, lacerations, ecchymosis. Neurological: Negative for headaches, focal weakness or numbness. 10-point ROS otherwise negative.  ____________________________________________   PHYSICAL EXAM:  VITAL SIGNS: ED Triage Vitals  Enc Vitals Group     BP 02/27/19 2104 129/83     Pulse Rate 02/27/19 2104 (!) 57     Resp 02/27/19 2104 18     Temp 02/27/19 2104 97.6 F (36.4 C)     Temp Source 02/27/19 2104 Oral     SpO2 02/27/19 2104 97 %     Weight 02/27/19 2105  133 lb (60.3 kg)     Height 02/27/19 2105  (1.727 m)     Head Circumference --      Peak Flow --      Pain Score 02/27/19 2104 0     Pain Loc --      Pain Edu? --      Excl. in GC? --      Constitutional: Alert and oriented. Well appearing and in no acute distress. Eyes: Conjunctivae are normal. PERRL. EOMI. Head: Atraumatic. Neck: No stridor.    Cardiovascular: Normal rate, regular rhythm. Normal S1 and S2.  Good peripheral circulation. Respiratory: Normal respiratory effort without tachypnea or retractions. Lungs CTAB. Good air entry to the bases with no  decreased or absent breath sounds. Musculoskeletal: Full range of motion to all extremities. No gross deformities appreciated.  Visualization of the left hand after removal of ulnar gutter splint reveals ecchymosis to the fifth digit, third digit.  Fourth and fifth digit remain buddy taped at this time.  Sensation and capillary refill intact to these digits.  Patient has full range of motion to the thumb, index and middle finger.  No loss of sensation.  Capillary refill less than 2 seconds all digits. Neurologic:  Normal speech and language. No gross focal neurologic deficits are appreciated.  Skin:  Skin is warm, dry and intact. No rash noted. Psychiatric: Mood and affect are normal. Speech and behavior are normal. Patient exhibits appropriate insight and judgement.   ____________________________________________   LABS (all labs ordered are listed, but only abnormal results are displayed)  Labs Reviewed - No data to display ____________________________________________  EKG   ____________________________________________  RADIOLOGY I personally viewed and evaluated these images as part of my medical decision making, as well as reviewing the written report by the radiologist.  Dg Hand Complete Left  Result Date: 02/27/2019 CLINICAL DATA:  Known fracture with increased bruising EXAM: LEFT HAND - COMPLETE 3+ VIEW COMPARISON:  02/24/2019 FINDINGS: Soft tissue swelling at the PIP joint of the third digit. No change in alignment of acute to subacute fracture involving the proximal fifth proximal phalanx. No new fracture abnormality. No periostitis. IMPRESSION: 1. No change in alignment of the acute to subacute fracture involving the proximal aspect of the fifth proximal phalanx 2. Soft tissue swelling at the third PIP joint. No additional fractures are visualized. Electronically Signed   By: Jasmine Pang M.D.   On: 02/27/2019 22:09     ____________________________________________    PROCEDURES  Procedure(s) performed:    .Splint Application Date/Time: 02/27/2019 11:12 PM Performed by: Racheal Patches, PA-C Authorized by: Racheal Patches, PA-C   Consent:    Consent obtained:  Verbal   Consent given by:  Patient   Risks discussed:  Pain, swelling and discoloration Pre-procedure details:    Sensation:  Normal Procedure details:    Laterality:  Left   Location:  Hand   Hand:  L hand   Splint type:  Ulnar gutter   Supplies:  Cotton padding, Ortho-Glass and elastic bandage Post-procedure details:    Pain:  Unchanged   Sensation:  Normal   Patient tolerance of procedure:  Tolerated well, no immediate complications      Medications - No data to display   ____________________________________________   INITIAL IMPRESSION / ASSESSMENT AND PLAN / ED COURSE  Pertinent labs & imaging results that were available during my care of the patient were reviewed by me and considered in my medical decision making (see chart  for details).  Review of the [place] CSRS was performed in accordance of the [facility] prior to dispensing any controlled drugs.      Patient's diagnosis is consistent with evaluation of splint, fracture to the proximal fifth finger.  Patient presented to the emergency department for evaluation of bruising under his splint.  After removal, patient does have ecchymosis.  X-ray was performed with good alignment maintained to the fifth digit and no other osseous findings.  Splint is reapplied.  Follow-up with orthopedics..Patient is given ED precautions to return to the ED for any worsening or new symptoms.     ____________________________________________  FINAL CLINICAL IMPRESSION(S) / ED DIAGNOSES  Final diagnoses:  Aftercare for cast or splint check or change  Closed nondisplaced fracture of proximal phalanx of left little finger with routine healing, subsequent encounter      NEW  MEDICATIONS STARTED DURING THIS VISIT:  ED Discharge Orders    None          This chart was dictated using voice recognition software/Dragon. Despite best efforts to proofread, errors can occur which can change the meaning. Any change was purely unintentional.    Racheal Patches, PA-C 02/27/19 2313    Minna Antis, MD 02/27/19 [phone]

## 2019-02-27 NOTE — ED Triage Notes (Signed)
Patient here on Thursday and dx with left fifth finger fx. Splint applied and was told that if any discoloration was present to return. Patient returns tonight with bruising over unaffected fingers; left middle finger has bruising over top of knuckle. Temperature is normal and there is no swelling. Affected fingers are normal in appearance, temperature and <2 sec cap refill.

## 2019-03-15 ENCOUNTER — Ambulatory Visit (INDEPENDENT_AMBULATORY_CARE_PROVIDER_SITE_OTHER): Payer: Non-veteran care | Admitting: Licensed Clinical Social Worker

## 2019-03-15 ENCOUNTER — Encounter: Payer: Self-pay | Admitting: Psychiatry

## 2019-03-15 ENCOUNTER — Other Ambulatory Visit: Payer: Self-pay

## 2019-03-15 ENCOUNTER — Encounter: Payer: Self-pay | Admitting: Licensed Clinical Social Worker

## 2019-03-15 ENCOUNTER — Ambulatory Visit (INDEPENDENT_AMBULATORY_CARE_PROVIDER_SITE_OTHER): Payer: Non-veteran care | Admitting: Psychiatry

## 2019-03-15 VITALS — BP 143/96 | HR 79 | Temp 97.5°F | Wt 130.0 lb

## 2019-03-15 DIAGNOSIS — F411 Generalized anxiety disorder: Secondary | ICD-10-CM

## 2019-03-15 DIAGNOSIS — F330 Major depressive disorder, recurrent, mild: Secondary | ICD-10-CM

## 2019-03-15 DIAGNOSIS — F1321 Sedative, hypnotic or anxiolytic dependence, in remission: Secondary | ICD-10-CM

## 2019-03-15 DIAGNOSIS — F4010 Social phobia, unspecified: Secondary | ICD-10-CM | POA: Diagnosis not present

## 2019-03-15 DIAGNOSIS — F1021 Alcohol dependence, in remission: Secondary | ICD-10-CM

## 2019-03-15 MED ORDER — CITALOPRAM HYDROBROMIDE 20 MG PO TABS: 30.0000 mg | ORAL_TABLET | Freq: Every day | ORAL | 0 refills | Status: DC

## 2019-03-15 NOTE — Progress Notes (Signed)
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  Patient walked with assistance of a walker today.  Patient appears to have some trouble when he starts walking however as long as he continues he is able to be able to hold his balance better.  He also continues to have jerking movements of his bilateral hands.  Patient today reports he does not think he is tolerating the higher dosage of Celexa well.  He is on 40 mg.  He was started on that higher dosage in December 2019.  Discussed readjusting his dosage.  He agrees with plan.  Will reduce to 30 mg.  He has supplies of Celexa 20 mg at home and he will make use of his supplies.  Patient reports he continues to have anxiety symptoms on and off.  He reports he has noticed that when he is talking to clients on phone he has trouble with his fluency with speech and he is not able to bring out words when he wants to and as he wants to.  He has been having trouble with it on and off for a long time.  He reports he also had recent fall.  It happened while he was alone and that is what he does not understand.  He reports he was under the impression that his anxiety and mobility gets worse when he is in stressful situations or in social situations.  He however reports he fell twice at least when he was alone and and its due to his balance issues.  He does not clearly understand why he continues to have these balance problems when he tries to walk.  He has fracture of his left hand and he is going to see a hand specialist today for continued care.  Discussed with patient that we have obtained medical records from his neurologist at the [place].  Per his neurologist  patient needs an MMPI and it is possible that his current symptoms are more psychological than anything else.  This was discussed with patient.  Patient reports he has upcoming appointment with his neurologist on April 6 and he plans to keep it.  He has his MMPI scheduled in May.  Patient continues to be in therapy sessions with Ms. Heidi Dach.  However discussed with patient if he continues to have significant anxiety problems would recommend more intensive program for his anxiety symptoms like IOP, PHP.  Patient advised to call his health insurance plan to find out who will cover such programs for him.   Visit Diagnosis: R/O Conversion disorder   ICD-10-CM   1. GAD (generalized anxiety disorder) F41.1 citalopram (CELEXA) 20 MG tablet  2. MDD (major depressive disorder), recurrent episode, mild (HCC) F33.0   3. Social anxiety disorder F40.10   4. 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 Kidney stones     Past Surgical History:  Procedure Laterality Date  . NOSE SURGERY  Family Psychiatric History: I have reviewed family psychiatric history from my progress note on 08/24/2018.  Family History:  Family History  Problem Relation Age of Onset  . Bipolar disorder Maternal Aunt     Social History: I have reviewed social history from my progress note on 08/24/2018. Social History   Socioeconomic History  . Marital status: Married    Spouse name: Bradley Parks  . Number of children: Not on file  . Years of education: Not on file  . Highest education level: Some college, no degree  Occupational History  . Not on file  Social Needs  . Financial resource strain: Not hard at all  . Food insecurity:    Worry: Never true    Inability:  Never true  . Transportation needs:    Medical: No    Non-medical: No  Tobacco Use  . Smoking status: Former Smoker    Last attempt to quit: 06/24/2017    Years since quitting: 1.7  . Smokeless tobacco: Never Used  Substance and Sexual Activity  . Alcohol use: Not Currently    Comment: occ.  . Drug use: No  . Sexual activity: Yes  Lifestyle  . Physical activity:    Days per week: 0 days    Minutes per session: 0 min  . Stress: Very much  Relationships  . Social connections:    Talks on phone: Not on file    Gets together: Not on file    Attends religious service: Never    Active member of club or organization: No    Attends meetings of clubs or organizations: Never    Relationship status: Married  Other Topics Concern  . Not on file  Social History Narrative  . Not on file    Allergies:  Allergies  Allergen Reactions  . Penicillins     Metabolic Disorder Labs: No results found for: HGBA1C, MPG No results found for: PROLACTIN No results found for: CHOL, TRIG, HDL, CHOLHDL, VLDL, LDLCALC No results found for: TSH  Therapeutic Level Labs: No results found for: LITHIUM No results found for: VALPROATE No components found for:  CBMZ  Current Medications: Current Outpatient Medications  Medication Sig Dispense Refill  . buPROPion (WELLBUTRIN) 75 MG tablet Take 1 tablet (75 mg total) by mouth daily with breakfast. 90 tablet 1  . hydrOXYzine (VISTARIL) 25 MG capsule Take 1 capsule (25 mg total) by mouth daily as needed (anxiety). 30 capsule 1  . ibuprofen (MOTRIN IB) 200 MG tablet Take 2 tablets (400 mg total) by mouth every 6 (six) hours as needed. 30 tablet 0  . primidone (MYSOLINE) 50 MG tablet Take 100 mg by mouth 2 (two) times daily.    . traZODone (DESYREL) 50 MG tablet Take 0.5-1 tablets (25-50 mg total) by mouth at bedtime as needed for sleep. 30 tablet 1  . citalopram (CELEXA) 20 MG tablet Take 1.5 tablets (30 mg total) by mouth daily. Patient reports he has  supplies 45 tablet 0   No current facility-administered medications for this visit.      Musculoskeletal: Strength & Muscle Tone: within normal limits Gait & Station: walks with walker Patient leans: N/A  Psychiatric Specialty Exam: Review of Systems  Neurological: Positive for tremors.  Psychiatric/Behavioral: The patient is nervous/anxious.   All other systems reviewed and are negative.   Blood pressure (!) 143/96, pulse 79, temperature (!) 97.5 F (36.4 C), temperature source Oral, weight 130 lb (59 kg).Body mass index is 19.77 kg/m.  General Appearance: Casual  Eye Contact:  Fair  Speech:  Clear and Coherent  Volume:  Normal  Mood:  Anxious  Affect:  Congruent  Thought Process:  Goal Directed and Descriptions of Associations: Intact  Orientation:  Full (Time, Place, and Person)  Thought Content: Logical   Suicidal Thoughts:  No  Homicidal Thoughts:  No  Memory:  Immediate;   Fair Recent;   Fair Remote;   Fair  Judgement:  Fair  Insight:  Fair  Psychomotor Activity:  Restlessness and Tremor  Concentration:  Concentration: Fair and Attention Span: Fair  Recall:  [employer] of Knowledge: Fair  Language: Fair  Akathisia:  No  Handed:  Right  AIMS (if indicated): patient has mild tremors of BL hands , chronic   Assets:  Communication Skills Desire for Improvement Social Support  ADL's:  Intact  Cognition: WNL  Sleep:  Fair   Screenings:   Assessment and Plan: Soo is a 38 year old Caucasian male, married, employed, has a history of depression, anxiety, alcohol and benzodiazepine dependence in remission, balance problems, tremors, presented to clinic today for a follow-up visit.  Patient is biologically predisposed given his history of trauma, family history of mental problems.  He also has a history of substance abuse/alcohol and benzodiazepine use disorder in remission.  Patient continues to struggle with neurological symptoms like gait instability, tremors.  I  have reviewed medical records from his neurologist at the [place] as summarized below.  Discussed plan as noted below.  Plan GAD-unstable Reduce Celexa to 30 mg p.o. daily. Patient believes the 40 mg is giving him side effects. He will continue psychotherapy sessions with Ms. Heidi Dach. Continue hydroxyzine 25 mg p.o. daily as needed for anxiety attacks  For MDD- some improvement Wellbutrin 75 mg p.o. daily Celexa as summarized above. Continue trazodone 25 to 50 mg p.o. nightly as needed-patient reports he is not using it anymore.  For alcohol use/benzodiazepine use disorder in remission Patient continues to remain sober.  Rule out conversion disorder Reviewed medical records from his neurologist at the [facility] Per notes by by neurologist-second opinion on Jan 05/2019-author Bolivar Haw - '' patient presented with his wife of 2 years, Bradley Parks.  He reported he began to have some sort of weakness 13 years ago when he was still in the [employer].  He has had gradual progression of numerous symptoms.  He claims that his joints are sore and lock up.  He has significant tremors which affects his eating and that sometimes his legs shake in the bed.  He has been falling frequently and wants at least had some injury to his face.  He has been treated with primidone 100 mg twice a day without apparent response.  He reports that alcohol decreases her tremor but that he was addicted to alcohol.  He stopped on 7/5/ 2018.  He was also given Klonopin for 4 years , he was addicted to it per report.  He was on a cruise recently as on 12/17/2018 and he was wheelchair-bound the entire time.  With careful re-questioning he and his wife both insist that this is a variable problem.  Some days he is normal or very close to but other days he can barely walk at all. Throughout the extensive examination the features are consistent with nonorganic psychosomatic illness.  Weakness comes and goes, his responses are  inconsistent with repeated identical testing. When we enter the hallway, he hobbles out there with his cane.  He is very hesitant but this exam  is inconsistent.  I am able to take his hand and walk with and down the hallway quite briskly.  Eventually he no longer needs his cane.  This coordination and gait testing was repeated several times including heel walk, tandem walk, toe walk.  Other atypical features of his exam with attempted tandem gait are consistent with non organic illness.  He has a stutter step when leading with the right foot and following with the left which he repeats identically at least 5-10 times but with no discoordination, imbalance of falling. He has 2+ reflexes at both knees, absent reflexes at both ankles. Cranial nerves II through XII, motor and coordination testing of both arms and both legs are entirely symmetric been giving his best effort.  Medical decision making- there is no evidence for neurologic illness that explains his symptoms.  I had discussed in detail with both of them that there is a condition known as conversion disorder.  His such circumstances, emotional factors can be converted into physical symptoms.  Because the pattern is concocted by his mind there is no similarity of symptoms detail or response to recognize neurological entities.  I cannot distinguish between conversion reaction disorder and malingering.  This distinction has to do with his conscious knowledge and degree of awareness.  Generally I have found known organic diseases such as his to have relatively poor prognosis.  If he is completely disabled from working, his disability would have to be based on psychiatric diagnosis and justification by psychiatrist.  There is no neurologic basis for such disability.  He is overtly depressed but this is not a neurological diagnosis.  At times he seems to be upset more than confused and irritated.  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  Suggestion Nerve conduction study to both lower extremities to confirm sensory neuropathy, EMG inclusion is up to Dr. Hildred Laserelsamia. Neuropsychological testing including MMPI may shed light on neuropsychiatric issues. Discontinue primidone and do not further attempt to treat psychosomatic illness with neurological medications.'  Discussed with patient that he continue if he continues to struggle with significant tremors and anxiety he would benefit from more intensive outpatient programs and [facility].  [facility] contact person Ms. Selma SinkRita also provided him information for aftercare groups.  Patient has not started the aftercare group and has not gone for the IOP program since he reports they do not accept his [employer]IC[employer] health insurance.  Discussed with patient to call his health insurance to get more information about intensive programs which they cover.  Follow up in clinic in 1 week or sooner if needed.  I have spent atleast 25 minutes face to face with patient today. More than 50 % of the time was spent for psychoeducation and supportive psychotherapy and care coordination.  This note was generated in part or whole with voice recognition software. Voice recognition is usually quite accurate but there are transcription errors that can and very often do occur. I apologize for any typographical errors that were not detected and corrected.            Jomarie LongsSaramma Kaytelynn Scripter, MD 03/15/2019, 5:32 PM

## 2019-03-15 NOTE — Progress Notes (Signed)
   THERAPIST PROGRESS NOTE  Session Time: 1100-1155  Participation Level: Active  Behavioral Response: Fairly GroomedAlertAnxious  Type of Therapy: Individual Therapy  Treatment Goals addressed: Anxiety  Interventions: Other: EMDR  Summary: Bradley Parks is a 38 y.o. male who presents with continued symptoms of his diagnosis. Bradley Parks presented utilizing a walker for his session, and reports he fell and broke his pinky since our last session. He reports he researched EMDR and would like to start utilizing that framework. To begin, we started with target sequence planning. Bradley Parks was asked to identify core beliefs that he identified with. Bradley Parks highlighted the belief, "I'm defective." His more adaptive belief was, "I'm okay, regardless." We then went through Bradley Parks's history and identified specific events where Bradley Parks remembered feeling defective. The first event he was able to identify was in bootcamp where he ultimately fell out of his bunkbed. Bradley Parks went through other events over the last fourteen years where he felt that way as well, including going through benzo withdrawal in 2014. Bradley Parks was then asked to articulate events that aligned with his adaptive belief. He reported recently running down the hall with the doctor, when he stopped drinking, working on a house with his dad, going through [employer], climbing a ladder in [place], etc. We then discussed what EMDR sessions would look like moving forward, and how we could properly prepare for that. Bradley Parks expressed understanding and agreement with this idea as well.   Suicidal/Homicidal: No  Therapist Response: Voyle continues to work towards his tx goals but has not yet reached them. We will begin utilizing EMDR moving forward.   Plan: Return again in 2 weeks.  Diagnosis: Axis I: Generalized Anxiety Disorder    Axis II: No diagnosis    Heidi Dach, LCSW 03/15/2019

## 2019-03-29 ENCOUNTER — Ambulatory Visit (INDEPENDENT_AMBULATORY_CARE_PROVIDER_SITE_OTHER): Payer: Non-veteran care | Admitting: Psychiatry

## 2019-03-29 ENCOUNTER — Other Ambulatory Visit: Payer: Self-pay

## 2019-03-29 ENCOUNTER — Encounter: Payer: Self-pay | Admitting: Psychiatry

## 2019-03-29 DIAGNOSIS — F411 Generalized anxiety disorder: Secondary | ICD-10-CM

## 2019-03-29 DIAGNOSIS — F4010 Social phobia, unspecified: Secondary | ICD-10-CM

## 2019-03-29 DIAGNOSIS — F1321 Sedative, hypnotic or anxiolytic dependence, in remission: Secondary | ICD-10-CM

## 2019-03-29 DIAGNOSIS — F1021 Alcohol dependence, in remission: Secondary | ICD-10-CM

## 2019-03-29 DIAGNOSIS — F330 Major depressive disorder, recurrent, mild: Secondary | ICD-10-CM | POA: Diagnosis not present

## 2019-03-29 MED ORDER — CITALOPRAM HYDROBROMIDE 20 MG PO TABS: 20.0000 mg | ORAL_TABLET | Freq: Every day | ORAL | 2 refills | Status: DC

## 2019-03-29 NOTE — Progress Notes (Signed)
Virtual Visit via Telephone Note  I connected with Bradley Parks on 03/29/19 at 10:15 AM EDT by telephone and verified that I am speaking with the correct person using two identifiers.   I discussed the limitations, risks, security and privacy concerns of performing an evaluation and management service by telephone and the availability of in person appointments. I also discussed with the patient that there may be a patient responsible charge related to this service. The patient expressed understanding and agreed to proceed.   I discussed the assessment and treatment plan with the patient. The patient was provided an opportunity to ask questions and all were answered. The patient agreed with the plan and demonstrated an understanding of the instructions.   The patient was advised to call back or seek an in-person evaluation if the symptoms worsen or if the condition fails to improve as anticipated.  I provided 15 minutes of non-face-to-face time during this encounter.   Bradley Longs, MD  [facility] MD  OP Progress Note  03/29/2019 1:27 PM Bradley Parks  MRN:  [id number]  Chief Complaint:  Chief Complaint    Follow-up     HPI: Bradley Parks is a 38 year old Caucasian male, married, employed, has a history of depression, anxiety, balance problems, tremors likely essential, was evaluated by phone today.  Patient reports because of the coronavirus outbreak he is currently working remotely from home.  He hence does not have to drive to his office in [place] daily.  Patient reports he is anxious about the situation however he and his family are coping okay.  Patient reports he tried the lower dosage of Celexa at 30 mg.  He reports this has helped with his tremors to some extent however he continues to feel shaking on and off.  He reports he would like to go further down to 20 mg.  He reports he would like to manage his anxiety symptoms and psychotherapy sessions with Ms. Heidi Dach and would like not to  depend on medications so much.  Discussed with patient that if once the dosage is reduced and if he has significant anxiety symptoms then would switch to another antidepressant/antianxiety agent.  In the meantime advised him to make use of hydroxyzine as needed.  Patient reports sleep is good.  Patient denies any suicidality, homicidality or perceptual disturbances.  Patient denies any other concerns today. Visit Diagnosis:    ICD-10-CM   1. GAD (generalized anxiety disorder) F41.1 citalopram (CELEXA) 20 MG tablet  2. MDD (major depressive disorder), recurrent episode, mild (HCC) F33.0   3. Social anxiety disorder F40.10   4. 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  Past Medical History:  Past Medical History:  Diagnosis Date  . Kidney stones     Past Surgical History:  Procedure Laterality Date  . NOSE SURGERY      Family Psychiatric History: I have reviewed family psychiatric history from my progress note on 08/24/2018.  Family History:  Family History  Problem Relation Age of Onset  . Bipolar disorder Maternal Aunt     Social History: I have reviewed social history from my progress note on 08/24/2018. Social History   Socioeconomic History  . Marital status: Married    Spouse name: [name]  . Number of children: Not on file  . Years of education: Not on file  .  Highest education level: Some college, no degree  Occupational History  . Not on file  Social Needs  . Financial resource strain: Not hard at all  . Food insecurity:    Worry: Never true    Inability: Never true  . Transportation needs:    Medical: No    Non-medical: No  Tobacco Use  . Smoking status: Former Smoker    Last attempt to quit: 06/24/2017    Years since quitting: 1.7  . Smokeless tobacco: Never Used   Substance and Sexual Activity  . Alcohol use: Not Currently    Comment: occ.  . Drug use: No  . Sexual activity: Yes  Lifestyle  . Physical activity:    Days per week: 0 days    Minutes per session: 0 min  . Stress: Very much  Relationships  . Social connections:    Talks on phone: Not on file    Gets together: Not on file    Attends religious service: Never    Active member of club or organization: No    Attends meetings of clubs or organizations: Never    Relationship status: Married  Other Topics Concern  . Not on file  Social History Narrative  . Not on file    Allergies:  Allergies  Allergen Reactions  . Penicillins     Metabolic Disorder Labs: No results found for: HGBA1C, MPG No results found for: PROLACTIN No results found for: CHOL, TRIG, HDL, CHOLHDL, VLDL, LDLCALC No results found for: TSH  Therapeutic Level Labs: No results found for: LITHIUM No results found for: VALPROATE No components found for:  CBMZ  Current Medications: Current Outpatient Medications  Medication Sig Dispense Refill  . buPROPion (WELLBUTRIN) 75 MG tablet Take 1 tablet (75 mg total) by mouth daily with breakfast. 90 tablet 1  . citalopram (CELEXA) 20 MG tablet Take 1 tablet (20 mg total) by mouth daily. 30 tablet 2  . hydrOXYzine (VISTARIL) 25 MG capsule Take 1 capsule (25 mg total) by mouth daily as needed (anxiety). 30 capsule 1  . ibuprofen (MOTRIN IB) 200 MG tablet Take 2 tablets (400 mg total) by mouth every 6 (six) hours as needed. 30 tablet 0  . primidone (MYSOLINE) 50 MG tablet Take 100 mg by mouth 2 (two) times daily.    . traZODone (DESYREL) 50 MG tablet Take 0.5-1 tablets (25-50 mg total) by mouth at bedtime as needed for sleep. 30 tablet 1   No current facility-administered medications for this visit.      Musculoskeletal: Strength & Muscle Tone: UTA Gait & Station: UTA Patient leans: N/A  Psychiatric Specialty Exam: Review of Systems  Neurological: Positive  for tremors.  Psychiatric/Behavioral: The patient is nervous/anxious.   All other systems reviewed and are negative.   There were no vitals taken for this visit.There is no height or weight on file to calculate BMI.  General Appearance: UTA  Eye Contact:  UTA  Speech:  Normal Rate  Volume:  Decreased  Mood:  Anxious  Affect:  UTA  Thought Process:  Goal Directed and Descriptions of Associations: Intact  Orientation:  Full (Time, Place, and Person)  Thought Content: Logical   Suicidal Thoughts:  No  Homicidal Thoughts:  No  Memory:  Immediate;   Fair Recent;   Fair Remote;   Fair  Judgement:  Fair  Insight:  Fair  Psychomotor Activity:  Reports he has tremors  Concentration:  Concentration: Fair and Attention Span: Fair  Recall:  Fair  Fund of Knowledge: Fair  Language: Fair  Akathisia:  No  Handed:  Right  AIMS (if indicated): na  Assets:  Communication Skills Desire for Improvement Housing  ADL's:  Intact  Cognition: WNL  Sleep:  Fair   Screenings:   Assessment and Plan: Chau is a 38 yr old CM ,married ,employed , has a history of depression , anxiety, alcohol and BZD dependence in remission, balance problems, tremors, was evaluated by phone today. Patient is biologically predisposed given his history of trauma, family history of mental health problems.  He also has a history of substance abuse/alcohol and benzodiazepine use disorder in remission.  Patient reports he continues to struggle with possible side effects of Celexa and hence would like to reduce the dosage further.  Patient would like to have more frequent psychotherapy sessions.  He will work with his therapist on the same.  Plan as noted below.  Plan GAD- unstable Reduce Celexa to 20 mg p.o. daily.  Patient reports it is giving him side effects. Discussed with him to make use of hydroxyzine 25 mg as needed for severe anxiety attacks He will make use of psychotherapy sessions with Ms. Heidi Dach and will  have more frequent therapy sessions.  For MDD-some improvement Wellbutrin 75 mg p.o. daily Trazodone 25 to 50 mg p.o. nightly as needed.  For alcohol use/benzodiazepine use disorder in remission Patient continues to remain sober.  Rule out conversion disorder- I have reviewed medical records from neurologist at [facility] in my note on 03/15/2019. Patient has upcoming appointment for MMPI in May 2020.  Patient was advised to call his health insurance to get more information about IOP program-pending.  Follow-up in clinic in 4 weeks or sooner if needed.  I have spent atleast 15 minutes non face to face with patient today. More than 50 % of the time was spent for psychoeducation and supportive psychotherapy and care coordination.  This note was generated in part or whole with voice recognition software. Voice recognition is usually quite accurate but there are transcription errors that can and very often do occur. I apologize for any typographical errors that were not detected and corrected.       Bradley Longs, MD 03/29/2019, 1:27 PM

## 2019-04-07 ENCOUNTER — Other Ambulatory Visit: Payer: Self-pay

## 2019-04-07 ENCOUNTER — Ambulatory Visit: Payer: Non-veteran care | Admitting: Licensed Clinical Social Worker

## 2019-04-27 ENCOUNTER — Ambulatory Visit (INDEPENDENT_AMBULATORY_CARE_PROVIDER_SITE_OTHER): Payer: Non-veteran care | Admitting: Psychiatry

## 2019-04-27 ENCOUNTER — Other Ambulatory Visit: Payer: Self-pay

## 2019-04-27 ENCOUNTER — Encounter: Payer: Self-pay | Admitting: Psychiatry

## 2019-04-27 DIAGNOSIS — F1021 Alcohol dependence, in remission: Secondary | ICD-10-CM

## 2019-04-27 DIAGNOSIS — F330 Major depressive disorder, recurrent, mild: Secondary | ICD-10-CM | POA: Diagnosis not present

## 2019-04-27 DIAGNOSIS — F4010 Social phobia, unspecified: Secondary | ICD-10-CM | POA: Diagnosis not present

## 2019-04-27 DIAGNOSIS — F411 Generalized anxiety disorder: Secondary | ICD-10-CM | POA: Diagnosis not present

## 2019-04-27 DIAGNOSIS — F1321 Sedative, hypnotic or anxiolytic dependence, in remission: Secondary | ICD-10-CM

## 2019-04-27 NOTE — Progress Notes (Signed)
Virtual Visit via Video Note  I connected with Bradley Fontroy Demarinis on 04/27/19 at  1:00 PM EDT by a video enabled telemedicine application and verified that I am speaking with the correct person using two identifiers.   I discussed the limitations of evaluation and management by telemedicine and the availability of in person appointments. The patient expressed understanding and agreed to proceed.   I discussed the assessment and treatment plan with the patient. The patient was provided an opportunity to ask questions and all were answered. The patient agreed with the plan and demonstrated an understanding of the instructions.   The patient was advised to call back or seek an in-person evaluation if the symptoms worsen or if the condition fails to improve as anticipated.   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  Patient today reports that he is currently working from home due to the COVID-19 crisis.  He reports work continues to be stressful because he currently has a lot of workload.  He however reports he has been coping okay.  He reports he has been compliant with his Celexa.  He however reports he stopped taking the Wellbutrin since he ran out.  Discussed with patient that it is impossible that he would have ran out since he was given 1343-month supply recently.  Patient reports he will check his prescription to see if it has a refill or not.  It is likely that patient continues to be noncompliant with recommendations.  Provided him medication education.  He reports he continues to follow-up with Ms. Heidi DachKelsey Craig, therapist.  He continues to struggle with tremors and some gait instability on and off.  He reports his  neurologist recently started him on propranolol which is helpful.  Patient denies any suicidality, homicidality or perceptual disturbances.  He reports sleep is good.  He denies any other concerns today. Visit Diagnosis:    ICD-10-CM   1. GAD (generalized anxiety disorder) F41.1   2. MDD (major depressive disorder), recurrent episode, mild (HCC) F33.0   3. Social anxiety disorder F40.10   4. 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  Past Medical History:  Past Medical History:  Diagnosis Date  . Kidney stones     Past Surgical History:  Procedure Laterality Date  . NOSE SURGERY      Family Psychiatric History: I have reviewed family psychiatric history from my progress note on 08/24/2018  Family History:  Family History  Problem Relation Age of Onset  . Bipolar disorder Maternal Aunt     Social History: Reviewed social history from my progress note on 08/24/2018 Social History   Socioeconomic History  . Marital status: Married    Spouse name: Ronni Rumblejaye  . Number of children: Not on file  . Years of education: Not on file  . Highest education level: Some college, no degree  Occupational History  . Not on file  Social Needs  . Financial resource strain: Not hard at all  . Food insecurity:    Worry: Never true    Inability: Never true  .  Transportation needs:    Medical: No    Non-medical: No  Tobacco Use  . Smoking status: Former Smoker    Last attempt to quit: 06/24/2017    Years since quitting: 1.8  . Smokeless tobacco: Never Used  Substance and Sexual Activity  . Alcohol use: Not Currently    Comment: occ.  . Drug use: No  . Sexual activity: Yes  Lifestyle  . Physical activity:    Days per week: 0 days    Minutes per session: 0 min  . Stress: Very much  Relationships  .  Social connections:    Talks on phone: Not on file    Gets together: Not on file    Attends religious service: Never    Active member of club or organization: No    Attends meetings of clubs or organizations: Never    Relationship status: Married  Other Topics Concern  . Not on file  Social History Narrative  . Not on file    Allergies:  Allergies  Allergen Reactions  . Penicillins     Metabolic Disorder Labs: No results found for: HGBA1C, MPG No results found for: PROLACTIN No results found for: CHOL, TRIG, HDL, CHOLHDL, VLDL, LDLCALC No results found for: TSH  Therapeutic Level Labs: No results found for: LITHIUM No results found for: VALPROATE No components found for:  CBMZ  Current Medications: Current Outpatient Medications  Medication Sig Dispense Refill  . buPROPion (WELLBUTRIN) 75 MG tablet Take 1 tablet (75 mg total) by mouth daily with breakfast. 90 tablet 1  . citalopram (CELEXA) 20 MG tablet Take 1 tablet (20 mg total) by mouth daily. 30 tablet 2  . hydrOXYzine (VISTARIL) 25 MG capsule Take 1 capsule (25 mg total) by mouth daily as needed (anxiety). 30 capsule 1  . ibuprofen (MOTRIN IB) 200 MG tablet Take 2 tablets (400 mg total) by mouth every 6 (six) hours as needed. 30 tablet 0  . primidone (MYSOLINE) 50 MG tablet Take 100 mg by mouth 2 (two) times daily.    . traZODone (DESYREL) 50 MG tablet Take 0.5-1 tablets (25-50 mg total) by mouth at bedtime as needed for sleep. 30 tablet 1   No current facility-administered medications for this visit.      Musculoskeletal: Strength & Muscle Tone: UTA Gait & Station: Walks with cane Patient leans: N/A  Psychiatric Specialty Exam: Review of Systems  Psychiatric/Behavioral: The patient is nervous/anxious.   All other systems reviewed and are negative.   There were no vitals taken for this visit.There is no height or weight on file to calculate BMI.  General Appearance: Casual  Eye Contact:  Fair  Speech:   Clear and Coherent  Volume:  Normal  Mood:  Anxious  Affect:  Congruent  Thought Process:  Goal Directed and Descriptions of Associations: Intact  Orientation:  Full (Time, Place, and Person)  Thought Content: Logical   Suicidal Thoughts:  No  Homicidal Thoughts:  No  Memory:  Immediate;   Fair Recent;   Fair Remote;   Fair  Judgement:  Fair  Insight:  Fair  Psychomotor Activity:  Normal  Concentration:  Concentration: Fair and Attention Span: Fair  Recall:  [employer] of Knowledge: Fair  Language: Fair  Akathisia:  No  Handed:  Right  AIMS (if indicated): does have mild tremors of UE  Assets:  Communication Skills Desire for Improvement Social Support  ADL's:  Intact  Cognition: WNL  Sleep:  Fair   Screenings:  Assessment and Plan: Bradley Parks is a 38 year old Caucasian male, married, employed, has a history of GAD, MDD, history of alcohol and benzodiazepine dependence in remission, balance problems, tremors was evaluated by telemedicine today.  Patient with history of trauma, family history of mental health problems as well as substance abuse problems is biologically predisposed due to the same.  Patient continues to struggle with the stress of  COVID-19 outbreak as well as his increased workload and his chronic medical problems.  Patient also appears to be noncompliant with his medications.  Provided medication education.  Discussed to continue to work with his therapist.  Plan as noted below.  Plan GAD- some improvement Celexa at reduced dosage of 20 mg p.o. daily Hydroxyzine 25 mg as needed for severe anxiety attacks Continue psychotherapy sessions with Ms. Tasia Catchings.  MDD-improving Wellbutrin 75 mg p.o. daily- patient likely noncompliant.  Provided education, encourage compliance. Trazodone 25 to 50 mg p.o. nightly as needed  For alcohol use/benzodiazepine use disorder in remission Patient continues to remain sober.  Rule out conversion disorder- patient has upcoming MMPI -  May 25, 2019.  Follow-up in clinic in 4 weeks or sooner if needed.  I have spent atleast 15 minutes non face to face with patient today. More than 50 % of the time was spent for psychoeducation and supportive psychotherapy and care coordination.  This note was generated in part or whole with voice recognition software. Voice recognition is usually quite accurate but there are transcription errors that can and very often do occur. I apologize for any typographical errors that were not detected and corrected.        [name], MD 04/28/2019, 8:52 AM

## 2019-04-28 ENCOUNTER — Encounter: Payer: Self-pay | Admitting: Psychiatry

## 2019-05-16 ENCOUNTER — Other Ambulatory Visit: Payer: Self-pay

## 2019-05-16 ENCOUNTER — Encounter: Payer: Self-pay | Admitting: Licensed Clinical Social Worker

## 2019-05-16 ENCOUNTER — Ambulatory Visit (INDEPENDENT_AMBULATORY_CARE_PROVIDER_SITE_OTHER): Payer: Non-veteran care | Admitting: Licensed Clinical Social Worker

## 2019-05-16 DIAGNOSIS — F411 Generalized anxiety disorder: Secondary | ICD-10-CM

## 2019-05-16 NOTE — Progress Notes (Signed)
Virtual Visit via Video Note  I connected with Bradley Parks on 05/16/19 at  8:00 AM EDT by a video enabled telemedicine application and verified that I am speaking with the correct person using two identifiers.   I discussed the limitations of evaluation and management by telemedicine and the availability of in person appointments. The patient expressed understanding and agreed to proceed.   I discussed the assessment and treatment plan with the patient. The patient was provided an opportunity to ask questions and all were answered. The patient agreed with the plan and demonstrated an understanding of the instructions.   The patient was advised to call back or seek an in-person evaluation if the symptoms worsen or if the condition fails to improve as anticipated.  I provided 45 minutes of non-face-to-face time during this encounter.   Heidi Dach, LCSW    THERAPIST PROGRESS NOTE  Session Time: 0800  Participation Level: Active  Behavioral Response: NeatAlertAnxious  Type of Therapy: Individual Therapy  Treatment Goals addressed: Anxiety  Interventions: CBT  Summary: Bradley Parks is a 38 y.o. male who presents with continued symptoms related to his diagnosis. Bradley Parks reports doing, "okay," since our last session. He reports continued problems around anxiety and panic, and has "just accepted it as normal because there's nothing physically wrong with me." Bradley Parks reports he is going for neuro-psych testing on the 27th, to determine if he has a conversion disorder. LCSW validated and normalized Bradley Parks's feelings of frustration and apathy towards this process. Bradley Parks reported he has come to accept he is "creating these situations in my head, and that's why I'm shaking and falling." LCSW validated Bradley Parks's statement, and highlighted the fact he had never made that statement previously. Bradley Parks reported he has come to accept it after the last round of testing. LCSW validated that idea, and encouraged Bradley Parks to  recognize this as a strength instead of a weakness. That is, if we know it is psychological, we can start addressing the symptoms more effectively. Bradley Parks expressed understanding and agreement with this idea. Bradley Parks reported frustration around being able to talk easily with LCSW, but stumbling over his words when he is working. He stated, "as soon as I hear that bell in my ear, I start to get anxious and then I feel like I can't talk." LCSW encouraged Bradley Parks to utilize distraction to help him decrease anxiety in those moments--take a deep breath when hearing the bell, practice hearing the bell without reacting when not at work, thinking through statements. [name] expressed understanding and agreement.   Further, we discussed when in-person visits resume, we will begin EMDR treatment to address underlying causes of anxiety and PTSD symptoms.   Suicidal/Homicidal: No  Therapist Response: Bradley Parks continues to work towards his tx goals but has not yet reached them. We will continue to work on decreasing anxiety and improving emotional regulation skills.   Plan: Return again in 2 weeks.  Diagnosis: Axis I: Generalized Anxiety Disorder    Axis II: No diagnosis    Heidi Dach, LCSW 05/16/2019

## 2019-06-01 ENCOUNTER — Encounter: Payer: Self-pay | Admitting: Psychiatry

## 2019-06-01 ENCOUNTER — Ambulatory Visit: Payer: Non-veteran care | Admitting: Licensed Clinical Social Worker

## 2019-06-01 ENCOUNTER — Ambulatory Visit (INDEPENDENT_AMBULATORY_CARE_PROVIDER_SITE_OTHER): Payer: Non-veteran care | Admitting: Psychiatry

## 2019-06-01 ENCOUNTER — Other Ambulatory Visit: Payer: Self-pay

## 2019-06-01 DIAGNOSIS — F1321 Sedative, hypnotic or anxiolytic dependence, in remission: Secondary | ICD-10-CM

## 2019-06-01 DIAGNOSIS — F330 Major depressive disorder, recurrent, mild: Secondary | ICD-10-CM | POA: Diagnosis not present

## 2019-06-01 DIAGNOSIS — F1021 Alcohol dependence, in remission: Secondary | ICD-10-CM

## 2019-06-01 DIAGNOSIS — F4010 Social phobia, unspecified: Secondary | ICD-10-CM | POA: Diagnosis not present

## 2019-06-01 DIAGNOSIS — F411 Generalized anxiety disorder: Secondary | ICD-10-CM | POA: Diagnosis not present
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
## 2019-06-17 ENCOUNTER — Other Ambulatory Visit: Payer: Self-pay

## 2019-06-17 ENCOUNTER — Encounter: Payer: Self-pay | Admitting: Licensed Clinical Social Worker

## 2019-06-17 ENCOUNTER — Ambulatory Visit (INDEPENDENT_AMBULATORY_CARE_PROVIDER_SITE_OTHER): Payer: Non-veteran care | Admitting: Licensed Clinical Social Worker

## 2019-06-17 DIAGNOSIS — F411 Generalized anxiety disorder: Secondary | ICD-10-CM | POA: Diagnosis not present

## 2019-06-17 NOTE — Progress Notes (Signed)
Virtual Visit via Video Note  I connected with Bradley Parks on 06/17/19 at 11:00 AM EDT by a video enabled telemedicine application and verified that I am speaking with the correct person using two identifiers.   I discussed the limitations of evaluation and management by telemedicine and the availability of in person appointments. The patient expressed understanding and agreed to proceed.   I discussed the assessment and treatment plan with the patient. The patient was provided an opportunity to ask questions and all were answered. The patient agreed with the plan and demonstrated an understanding of the instructions.   The patient was advised to call back or seek an in-person evaluation if the symptoms worsen or if the condition fails to improve as anticipated.  I provided 45 minutes of non-face-to-face time during this encounter.   Alden Hipp, LCSW    THERAPIST PROGRESS NOTE  Session Time: 1100  Participation Level: Active  Behavioral Response: CasualAlertAnxious  Type of Therapy: Individual Therapy  Treatment Goals addressed: Coping  Interventions: CBT  Summary: Bradley Parks is a 38 y.o. male who presents with continued symptoms related to his diagnosis. Bradley Parks reports doing well since our last session. He reports feeling frustrated with some aspects of his life, such as his communication with his wife, but reports other areas are going well. He stated he began taking the Trazadone more regularly, and has noticed a decrease in his anxiety. LCSW encouraged Bradley Parks to discuss this with his MD at their next session. Bradley Parks expressed agreement and reported he missed his last appointment and needs to reschedule it. Bradley Parks reported he recently got into an argument with his wife during which she "was an alpha and just told me I needed to show her x, y, and z that Bradley Parks been making an effort on. And, I told her that I had been making an effort and kind of went off." Bradley Parks reported his wife came back  later to apologize and agreed she was out of line. LCSW validated this progress but encouraged Bradley Parks to utilize assertive communication during the next situation, as "going off," often results in more escalation rather than calming the situation down. Bradley Parks was able to accept this and expressed understanding. Bradley Parks reported he is focusing more on working through his falls and uncertainty while walking, rather than attempting to avoid situations where he might feel uncomfortable. LCSW validated this and highlighted how much progress that statement was. Bradley Parks also recognized this and expressed understanding.   Suicidal/Homicidal: No  Therapist Response: Bradley Parks continues to work towards his tx goals but has not yet reached them. We will continue to work on emotional regulation skills and decreasing anxiety in the moment.   Plan: Return again in 4 weeks.  Diagnosis: Axis I: Generalized Anxiety Disorder    Axis II: No diagnosis    Alden Hipp, LCSW 06/17/2019

## 2019-07-13 ENCOUNTER — Other Ambulatory Visit: Payer: Self-pay

## 2019-07-13 ENCOUNTER — Encounter: Payer: Self-pay | Admitting: Licensed Clinical Social Worker

## 2019-07-13 ENCOUNTER — Ambulatory Visit (INDEPENDENT_AMBULATORY_CARE_PROVIDER_SITE_OTHER): Payer: Non-veteran care | Admitting: Licensed Clinical Social Worker

## 2019-07-13 DIAGNOSIS — F330 Major depressive disorder, recurrent, mild: Secondary | ICD-10-CM

## 2019-07-13 DIAGNOSIS — F411 Generalized anxiety disorder: Secondary | ICD-10-CM

## 2019-07-13 NOTE — Progress Notes (Signed)
Virtual Visit via Video Note  I connected with Bradley Parks on 07/13/19 at  9:00 AM EDT by a video enabled telemedicine application and verified that I am speaking with the correct person using two identifiers.   I discussed the limitations of evaluation and management by telemedicine and the availability of in person appointments. The patient expressed understanding and agreed to proceed.   I discussed the assessment and treatment plan with the patient. The patient was provided an opportunity to ask questions and all were answered. The patient agreed with the plan and demonstrated an understanding of the instructions.   The patient was advised to call back or seek an in-person evaluation if the symptoms worsen or if the condition fails to improve as anticipated.  I provided 45 minutes of non-face-to-face time during this encounter.   Alden Hipp, LCSW    THERAPIST PROGRESS NOTE  Session Time: 0900  Participation Level: Active  Behavioral Response: NAAlertAnxious and Depressed  Type of Therapy: Individual Therapy  Treatment Goals addressed: Anxiety  Interventions: Supportive  Summary: Bradley Parks is a 38 y.o. male who presents with continued symptoms related to his diagnosis. Bradley Parks reports doing well since our last session, but added he has been depressed at times. He reports his anxiety was increasing when he was on social media due to the [place]. He reported to attempt to manage his anxiety, he deleted and deactivated his Facebook account. LCSW validated this insight and pointed out how Bradley Parks was able to come to that conclusion by thinking through what was causing his anxiety. Bradley Parks went on to discussing his marriage, and current issues within their relationship. He reports, "she gets mad at me about everything. She doesn't ask for help then gets mad when she doesn't get help. Or, she'll take things on and then get mad she's doing it." LCSW encouraged Bradley Parks to  point these things out to his wife in a calm, appropriate way when they occurs, so he can help facilitate more effective communication. Bradley Parks expressed understanding and agreement with this idea. We discussed how to utilize I statements in communication with his wife to improve their relationships and discussions.   Suicidal/Homicidal: No  Therapist Response: Bradley Parks continues to work towards his tx goals but has not yet reached them. We will continue to work on emotional regulation skills and managing anxiety in the moment.   Plan: Return again in 4 weeks.  Diagnosis: Axis I: Post Traumatic Stress Disorder    Axis II: No diagnosis    Alden Hipp, LCSW 07/13/2019

## 2019-08-15 ENCOUNTER — Encounter: Payer: Self-pay | Admitting: Licensed Clinical Social Worker

## 2019-08-15 ENCOUNTER — Ambulatory Visit (INDEPENDENT_AMBULATORY_CARE_PROVIDER_SITE_OTHER): Payer: Non-veteran care | Admitting: Licensed Clinical Social Worker

## 2019-08-15 ENCOUNTER — Other Ambulatory Visit: Payer: Self-pay

## 2019-08-15 DIAGNOSIS — F411 Generalized anxiety disorder: Secondary | ICD-10-CM | POA: Diagnosis not present
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
## 2019-09-01 ENCOUNTER — Ambulatory Visit (INDEPENDENT_AMBULATORY_CARE_PROVIDER_SITE_OTHER): Payer: No Typology Code available for payment source | Admitting: Licensed Clinical Social Worker

## 2019-09-01 ENCOUNTER — Other Ambulatory Visit: Payer: Self-pay

## 2019-09-01 ENCOUNTER — Encounter: Payer: Self-pay | Admitting: Licensed Clinical Social Worker

## 2019-09-01 DIAGNOSIS — F411 Generalized anxiety disorder: Secondary | ICD-10-CM

## 2019-09-01 NOTE — Progress Notes (Signed)
Virtual Visit via Telephone Note  I connected with Bradley Parks on 09/01/19 at  8:00 AM EDT by telephone and verified that I am speaking with the correct person using two identifiers.   I discussed the limitations, risks, security and privacy concerns of performing an evaluation and management service by telephone and the availability of in person appointments. I also discussed with the patient that there may be a patient responsible charge related to this service. The patient expressed understanding and agreed to proceed.  I discussed the assessment and treatment plan with the patient. The patient was provided an opportunity to ask questions and all were answered. The patient agreed with the plan and demonstrated an understanding of the instructions.   The patient was advised to call back or seek an in-person evaluation if the symptoms worsen or if the condition fails to improve as anticipated.  I provided 30 minutes of non-face-to-face time during this encounter.   Bradley Hipp, LCSW    THERAPIST PROGRESS NOTE  Session Time: 0800  Participation Level: Minimal  Behavioral Response: NeatAlertAnxious  Type of Therapy: Individual Therapy  Treatment Goals addressed: Anxiety  Interventions: CBT  Summary: Bradley Parks is a 38 y.o. male who presents with continued symptoms related to his diagnosis. Bradley Parks reports doing well since our last session. He reported being tired this morning as he just woke up, and noted he needed to end the visit at 8:30am due to work. LCSW expressed understanding and agreement. Bradley Parks reports his mood has been less positive lately, and he feels he is too negative throughout the day. LCSW validated these feelings, and encouraged Bradley Parks to determine if he is bothered by his negative attitude or if those around him are--if it's the latter, then we don't need to worry about it as much. Bradley Parks was unsure which it was in this moment, so we reviewed ways he could manage negative  thoughts in the moment to improve his thought process. We reviewed CBT skills and how he could utilize them to change his thinking patterns. Bradley Parks expressed understanding and agreement with how he could utilize CBT skills moving forward. Bradley Parks reported he was unable to discuss his marriage or home life due to his wife being home, "maybe we can hash that out next time." LCSW expressed agreement. Bradley Parks reported, otherwise, he is doing well and is back in school and is carrying three classes this semester.   Suicidal/Homicidal: No  Therapist Response: Bradley Parks continues to work towards his tx goals but has not yet reached them. He is resistant to utilizing CBT skills during sessions, but is able to understand the concepts within the framework. Bradley Parks is not able to speak openly during sessions when he is home, due to his wife being at home as well. Because of this, Bradley Parks is not able to discuss some of his current stressors.   Plan: Return again in 3 weeks.  Diagnosis: Axis I: Generalized Anxiety Disorder    Axis II: No diagnosis    Bradley Hipp, LCSW 09/01/2019

## 2019-09-21 ENCOUNTER — Other Ambulatory Visit: Payer: Self-pay

## 2019-09-21 ENCOUNTER — Encounter: Payer: Self-pay | Admitting: Licensed Clinical Social Worker

## 2019-09-21 ENCOUNTER — Ambulatory Visit (INDEPENDENT_AMBULATORY_CARE_PROVIDER_SITE_OTHER): Payer: No Typology Code available for payment source | Admitting: Licensed Clinical Social Worker

## 2019-09-21 DIAGNOSIS — F411 Generalized anxiety disorder: Secondary | ICD-10-CM | POA: Diagnosis not present

## 2019-09-21 NOTE — Progress Notes (Signed)
  Virtual Visit via Video Note  I connected with Bradley Parks on 09/21/19 at  8:00 AM EDT by a video enabled telemedicine application and verified that I am speaking with the correct person using two identifiers.   I discussed the limitations of evaluation and management by telemedicine and the availability of in person appointments. The patient expressed understanding and agreed to proceed.  I discussed the assessment and treatment plan with the patient. The patient was provided an opportunity to ask questions and all were answered. The patient agreed with the plan and demonstrated an understanding of the instructions.   The patient was advised to call back or seek an in-person evaluation if the symptoms worsen or if the condition fails to improve as anticipated.  I provided 45 minutes of non-face-to-face time during this encounter.   Alden Hipp, LCSW   THERAPIST PROGRESS NOTE  Session Time: 0800  Participation Level: Active  Behavioral Response: NeatAlertAnxious  Type of Therapy: Individual Therapy  Treatment Goals addressed: Coping  Interventions: Supportive  Summary: Bradley Parks is a 38 y.o. male who presents with continued symptoms related to his diagnosis. Bradley Parks reports doing well since our last session. He reports he has been stressed with school work and work, but has been able to manage his anxiety around those areas. He reports he and his wife are in the process of building a home in [place], which Bradley Parks stated does not stress him out. He stated, "she gets stressed about that stuff and then I get stressed when she's stressed." LCSW validated those feelings and encouraged Bradley Parks to be more involved to possibly take some of the weight of his wife's shoulders. We discussed various ways he could do this, and discussed what the implications of that would be. Bradley Parks was able to understand this idea and stated he planned to be more involved moving forward. We moved on to discussing social  media and how it can have a negative impact on someone's life. Bradley Parks is very passionate about this subject, so was able to point out various issues that come about due to social media. LCSW held space for Bradley Parks to discuss his feelings and observations.   Suicidal/Homicidal: No  Therapist Response: Bradley Parks continues to work towards his tx goals but has not yet reached them. We will continue to work on emotional regulation skills and challenging negative thoughts moving forward.   Plan: Return again in 4 weeks.  Diagnosis: Axis I: Generalized Anxiety Disorder    Axis II: No diagnosis    Alden Hipp, LCSW 09/21/2019

## 2019-10-19 ENCOUNTER — Ambulatory Visit (INDEPENDENT_AMBULATORY_CARE_PROVIDER_SITE_OTHER): Payer: No Typology Code available for payment source | Admitting: Licensed Clinical Social Worker

## 2019-10-19 ENCOUNTER — Other Ambulatory Visit: Payer: Self-pay

## 2019-10-19 ENCOUNTER — Encounter: Payer: Self-pay | Admitting: Licensed Clinical Social Worker

## 2019-10-19 DIAGNOSIS — F411 Generalized anxiety disorder: Secondary | ICD-10-CM

## 2019-10-19 NOTE — Progress Notes (Signed)
Virtual Visit via Video Note  I connected with Bradley Parks on 10/19/19 at 11:00 AM EDT by a video enabled telemedicine application and verified that I am speaking with the correct person using two identifiers.   I discussed the limitations of evaluation and management by telemedicine and the availability of in person appointments. The patient expressed understanding and agreed to proceed.  I discussed the assessment and treatment plan with the patient. The patient was provided an opportunity to ask questions and all were answered. The patient agreed with the plan and demonstrated an understanding of the instructions.   The patient was advised to call back or seek an in-person evaluation if the symptoms worsen or if the condition fails to improve as anticipated.  I provided 45 minutes of non-face-to-face time during this encounter.   Alden Hipp, LCSW    THERAPIST PROGRESS NOTE  Session Time: 1100  Participation Level: Active  Behavioral Response: CasualAlertDepressed  Type of Therapy: Individual Therapy  Treatment Goals addressed: Coping  Interventions: CBT  Summary: Bradley Parks is a 38 y.o. male who presents with continued symptoms related to his diagnosis. Bradley Parks reports doing well since our last session. He reports at times his anxiety has been high, and he feels like "there's no point," to figuring out how to improve it. We discussed ways to improve his thought process, including utilizing more CBT skills. Bradley Parks denied any SI at this time, but noted  past passive SI. LCSw encouraged Bradley Parks to utilize thought challenging techniques to change the way he is communicating to himself about himself. Bradley Parks expressed understanding and agreement on how he could utilize CBT to assist him in managing his anxiety symptoms. He reports feeling anxious about his relationship with his wife at times, as he often feels he is unable to communicate with her about his feelings on things. Bradley Parks reported often  worrying about his wife getting angry, and therefore often keeps things to himself. We discussed how passive communication can hinder a relationship, and reviewed ways Bradley Parks could broach subjects with his wife without eliciting a negative response. Bradley Parks expressed understanding and agreement with this information as well.   Suicidal/Homicidal: No  Therapist Response: Bradley Parks continues to work towards his tx goals but has not yet reached them. We will continue to work on emotional regulation skills moving forward.   Plan: Return again in 4 weeks.  Diagnosis: Axis I: Generalized Anxiety Disorder    Axis II: No diagnosis    Alden Hipp, LCSW 10/19/2019

## 2019-11-22 ENCOUNTER — Other Ambulatory Visit: Payer: Self-pay

## 2019-11-22 ENCOUNTER — Encounter: Payer: Self-pay | Admitting: Licensed Clinical Social Worker

## 2019-11-22 ENCOUNTER — Ambulatory Visit (INDEPENDENT_AMBULATORY_CARE_PROVIDER_SITE_OTHER): Payer: No Typology Code available for payment source | Admitting: Licensed Clinical Social Worker

## 2019-11-22 DIAGNOSIS — F330 Major depressive disorder, recurrent, mild: Secondary | ICD-10-CM | POA: Diagnosis not present

## 2019-11-22 DIAGNOSIS — F411 Generalized anxiety disorder: Secondary | ICD-10-CM | POA: Diagnosis not present

## 2019-11-22 NOTE — Progress Notes (Signed)
  Virtual Visit via Video Note  I connected with Bradley Parks on 11/22/19 at 11:00 AM EST by a video enabled telemedicine application and verified that I am speaking with the correct person using two identifiers.   I discussed the limitations of evaluation and management by telemedicine and the availability of in person appointments. The patient expressed understanding and agreed to proceed.    I discussed the assessment and treatment plan with the patient. The patient was provided an opportunity to ask questions and all were answered. The patient agreed with the plan and demonstrated an understanding of the instructions.   The patient was advised to call back or seek an in-person evaluation if the symptoms worsen or if the condition fails to improve as anticipated.  I provided 53 minutes of non-face-to-face time during this encounter.   Alden Hipp, LCSW   THERAPIST PROGRESS NOTE  Session Time: 1100  Participation Level: Active  Behavioral Response: NeatAlertAnxious  Type of Therapy: Individual Therapy  Treatment Goals addressed: Coping  Interventions: CBT  Summary: Bradley Parks is a 38 y.o. male who presents with continued symptoms related to his diagnosis. Bradley Parks reports doing well since our last session, and noted his anxiety/depression has been manageable. Bradley Parks reports he has been getting into arguments more frequently with his wife, and he has been trying to actively work on his communication. He reports his wife has started hinting that she would like to move back to [place], and Bradley Parks reports he is on board with this idea. "I've told her that I don't care but she keeps going back and forth. She's acting like I'm making her stay here or something like that." LCSW encouraged Bradley Parks to open up to his wife a bit more, and instead of using the words "I don't care," maybe use different words. For instance, "I'm happy to be wherever you feel the most comfortable. I'd be happy in [place]  if you think that would be a good move." LCSW held space to discuss the difference between being active in a decision and being passive. LCSW encouraged Bradley Parks to be more active in decision making within his relationship. Bradley Parks reported understanding and agreement with information presented. "I just kind of like to sit in the background and let things happen." Bradley Parks was able to understand the need to be more active in certain settings and being in the background in others.   Suicidal/Homicidal: No  Therapist Response: Bradley Parks continues to work towards his tx goals but has not yet reached them. We will continue to work on improving assertive communication and utilizing CBT skills moving forward.   Plan: Return again in 4 weeks.  Diagnosis: Axis I: Generalized Anxiety Disorder    Axis II: No diagnosis    Alden Hipp, LCSW 11/22/2019

## 2019-12-21 ENCOUNTER — Encounter: Payer: Self-pay | Admitting: Licensed Clinical Social Worker

## 2019-12-21 ENCOUNTER — Ambulatory Visit (INDEPENDENT_AMBULATORY_CARE_PROVIDER_SITE_OTHER): Payer: No Typology Code available for payment source | Admitting: Licensed Clinical Social Worker

## 2019-12-21 ENCOUNTER — Other Ambulatory Visit: Payer: Self-pay

## 2019-12-21 DIAGNOSIS — F411 Generalized anxiety disorder: Secondary | ICD-10-CM | POA: Diagnosis not present

## 2019-12-21 DIAGNOSIS — F330 Major depressive disorder, recurrent, mild: Secondary | ICD-10-CM

## 2019-12-21 NOTE — Progress Notes (Signed)
Virtual Visit via Telephone Note  I connected with Edge Mauger on 12/21/19 at 11:00 AM EST by telephone and verified that I am speaking with the correct person using two identifiers.   I discussed the limitations, risks, security and privacy concerns of performing an evaluation and management service by telephone and the availability of in person appointments. I also discussed with the patient that there may be a patient responsible charge related to this service. The patient expressed understanding and agreed to proceed.    I discussed the assessment and treatment plan with the patient. The patient was provided an opportunity to ask questions and all were answered. The patient agreed with the plan and demonstrated an understanding of the instructions.   The patient was advised to call back or seek an in-person evaluation if the symptoms worsen or if the condition fails to improve as anticipated.  I provided 45 minutes of non-face-to-face time during this encounter.   Alden Hipp, LCSW    THERAPIST PROGRESS NOTE  Session Time: 1100  Participation Level: Active  Behavioral Response: NeatAlertDepressed  Type of Therapy: Individual Therapy  Treatment Goals addressed: Coping  Interventions: Supportive  Summary: Salik Grewell is a 38 y.o. male who presents with continued symptoms related to her diagnosis. Cornell Barman reports doing "the same," since the last time we spoke. Dyllon reports he went off his medication "a long time ago, I ran out and didn't refill it." Kristine reported since then he hasn't noticed any difference in his mood; however, the longer we spoke in the session the more clear mood changes were. Aldwin reports there was a period of time where he prayed not to wake up each night, but states he has stopped doing that. "I realized that I'm here, and I'm not going anywhere. I just hate not being able to function like everyone else." LCSW validated Joaquim's feelings and encouraged him to reach  out in moments he is feeling like that. We also discussed previous ideas where Haydan was required to work on things outside of sessions, and would not follow through. Jishnu was able to recognize this as well, and stated he was unable to understand why he couldn't motivate himself to do the exercises. We discussed ways to increase motivation and how the mood symptoms would hopefully improve when he went back on medication. Bentlee reports having an appointment with a doctor at the [place] coming up. Olivia also reported feeling he doesn't have a say in a lot of things within his marriage. LCSW encouraged Deontrae to begin working on those things in his marriage by observing the times he is told he should not have an opinion about something, and think further into why he's not supposed to. Vander expressed understanding and agreement.   Suicidal/Homicidal: No  Therapist Response: Giavanni continues to work towards his tx goals but has not yet reached them. We will continue to work on improving distress tolerance and emotional regulation skills moving forward.   Plan: Return again in 4 weeks.  Diagnosis: Axis I: Generalized Anxiety Disorder    Axis II: No diagnosis    Alden Hipp, LCSW 12/21/2019

## 2020-01-17 ENCOUNTER — Ambulatory Visit (INDEPENDENT_AMBULATORY_CARE_PROVIDER_SITE_OTHER): Payer: No Typology Code available for payment source | Admitting: Licensed Clinical Social Worker

## 2020-01-17 ENCOUNTER — Encounter: Payer: Self-pay | Admitting: Licensed Clinical Social Worker

## 2020-01-17 ENCOUNTER — Other Ambulatory Visit: Payer: Self-pay

## 2020-01-17 DIAGNOSIS — F411 Generalized anxiety disorder: Secondary | ICD-10-CM

## 2020-01-17 NOTE — Progress Notes (Signed)
Virtual Visit via Telephone Note  I connected with Bradley Parks on 01/17/20 at 11:00 AM EST by telephone and verified that I am speaking with the correct person using two identifiers.   I discussed the limitations, risks, security and privacy concerns of performing an evaluation and management service by telephone and the availability of in person appointments. I also discussed with the patient that there may be a patient responsible charge related to this service. The patient expressed understanding and agreed to proceed.   I discussed the assessment and treatment plan with the patient. The patient was provided an opportunity to ask questions and all were answered. The patient agreed with the plan and demonstrated an understanding of the instructions.   The patient was advised to call back or seek an in-person evaluation if the symptoms worsen or if the condition fails to improve as anticipated.  I provided 45 minutes of non-face-to-face time during this encounter.   Heidi Dach, Bradley    THERAPIST PROGRESS NOTE  Session Time: 1100  Participation Level: Active  Behavioral Response: CasualAlertAnxious  Type of Therapy: Individual Therapy  Treatment Goals addressed: Coping  Interventions: CBT and Supportive  Summary: Bradley Parks is a 39 y.o. male who presents with continued symptoms related to his diagnosis. Bradley Parks Parks doing "about the same," since our last session. He Parks feeling depressed and having no motivation to work towards his goals. Bradley Parks feeling he has no self confidence and is unsure why he's gotten that way. Bradley validated Bradley Parks's feelings and encouraged him to think about when he started to feel this shift from "I  Can do this," to "I can't do this." Bradley Parks to discuss incidents from his past that have contributed to this feeling. Bradley Parks when he was working [employer], he got picked on a lot by his family members and others. Bradley  validated Bradley Parks's feelings in these situations, and encouraged him to practice CBT when he begins feeling anxious about completing a task. [name] he has essentially stopped trying to work towards walking without his walker, and has not done any exercise to attempt to strengthen his muscles to help facilitate better progress. Bradley Parks expressed understanding and agreement with setting small goals and beginning to regain motivation towards walking independently.   Suicidal/Homicidal: No  Therapist Response: Bradley Parks continues to work towards his tx goals but has not yet reached them. We will continue to work on improving emotional regulation skills and CBT skills moving forward.   Plan: Return again in 3 weeks.  Diagnosis: Axis I: Generalized Anxiety Disorder    Axis II: No diagnosis    Heidi Dach, Bradley 01/17/2020

## 2020-01-29 IMAGING — CR DG HAND COMPLETE 3+V*L*
1 series · 3 of 3 positions shown · non-contrast
Comparison: 02/24/2019

CLINICAL DATA: Known fracture with increased bruising

EXAM:
LEFT HAND - COMPLETE 3+ VIEW

[Series 1: dg hand complete left · 0.14mm/px · 3 of 3 slices shown]
[im 1/3]
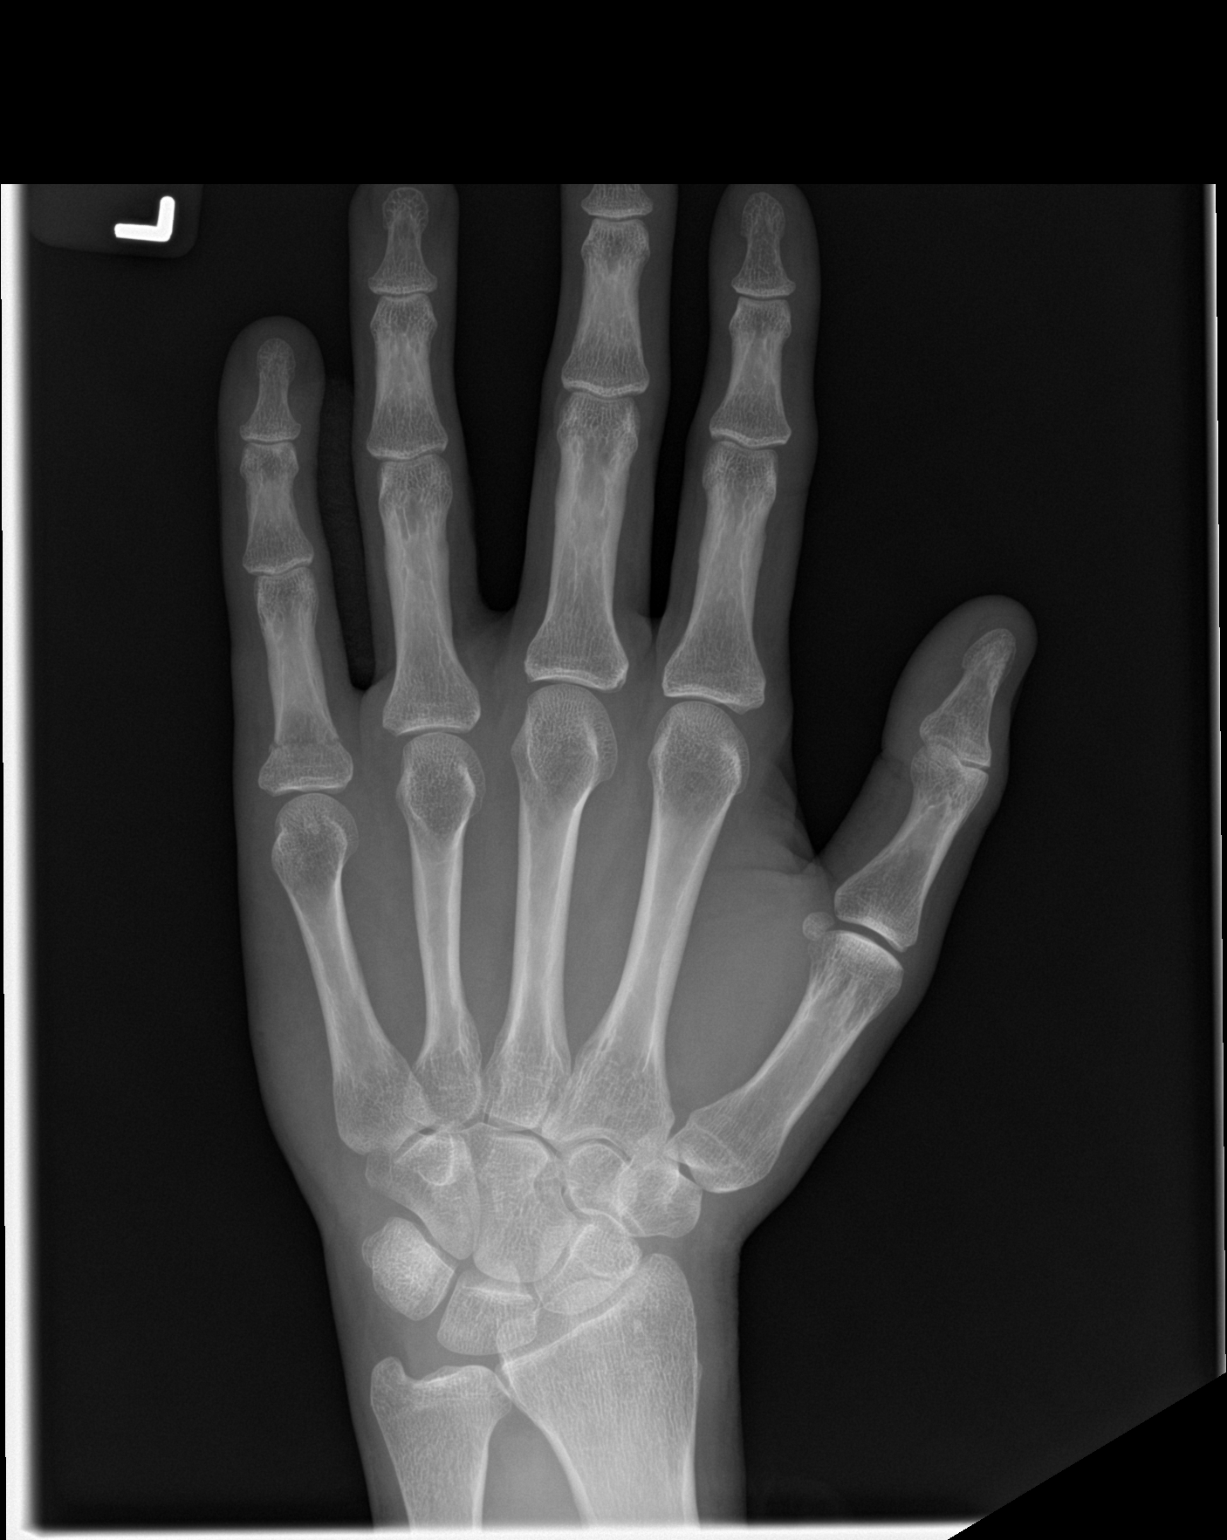
[im 2/3]
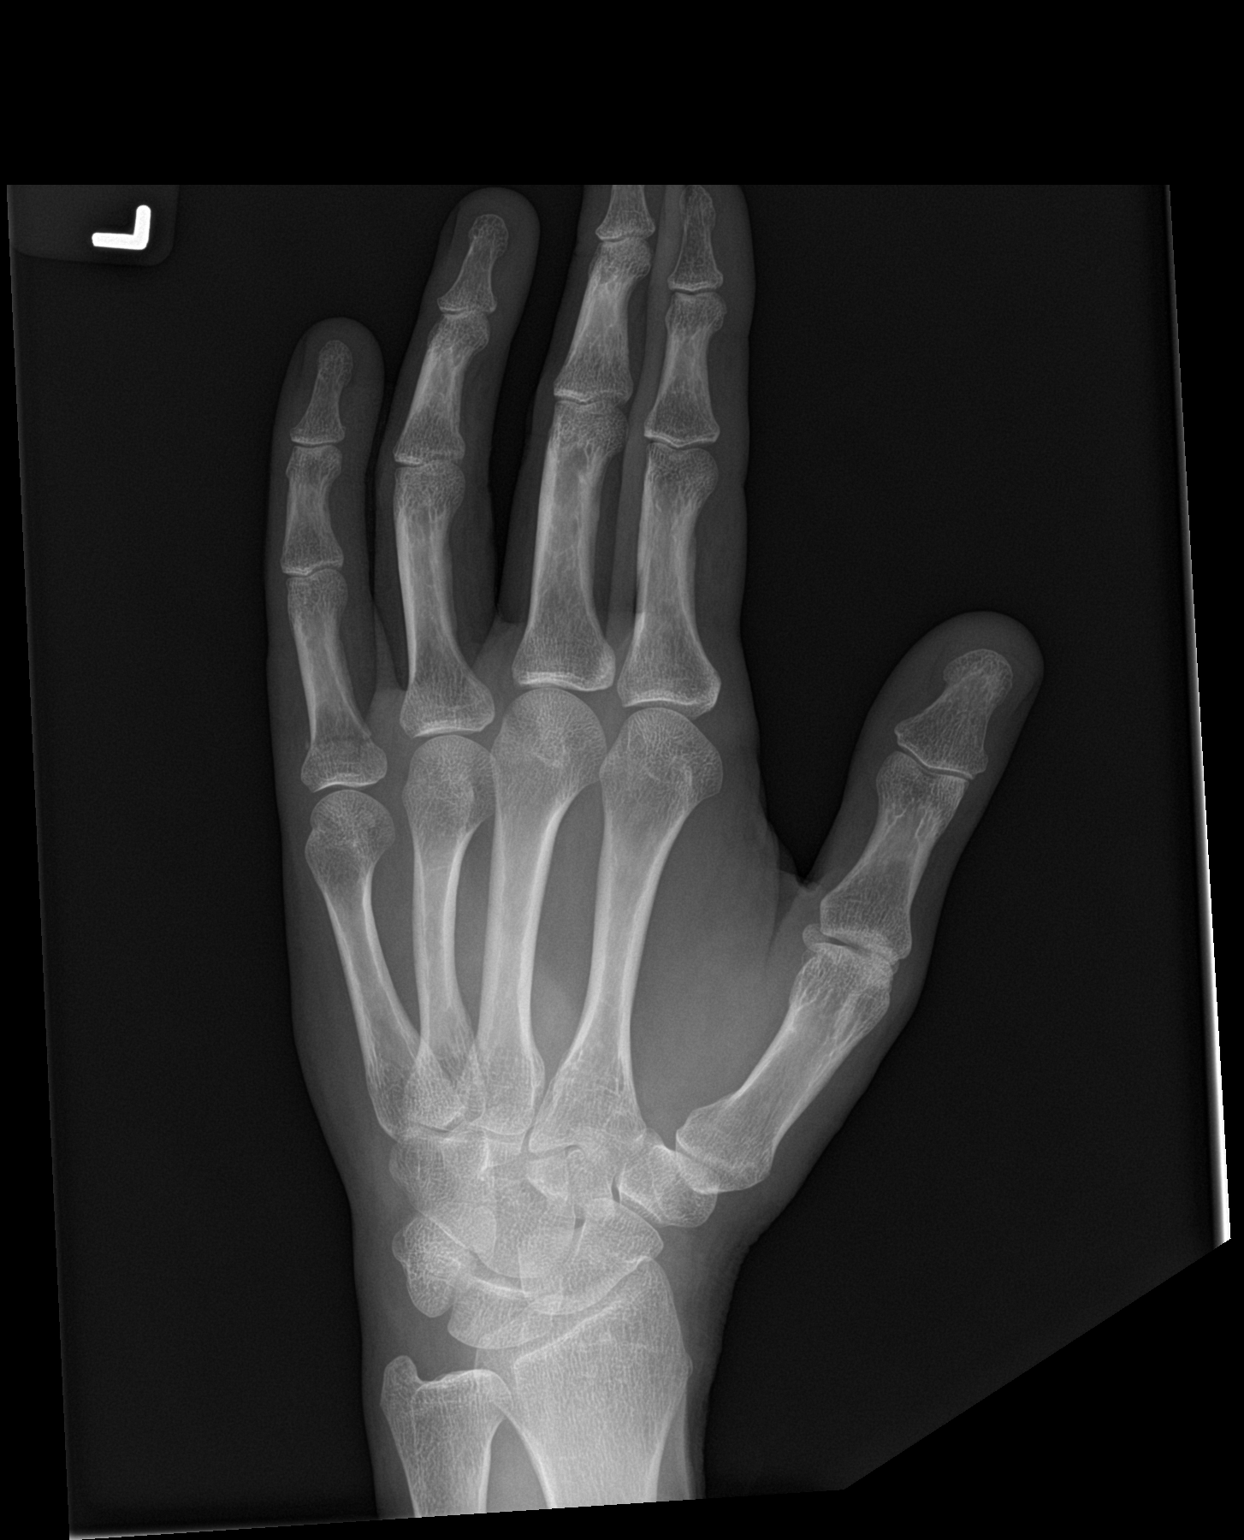
[im 3/3]
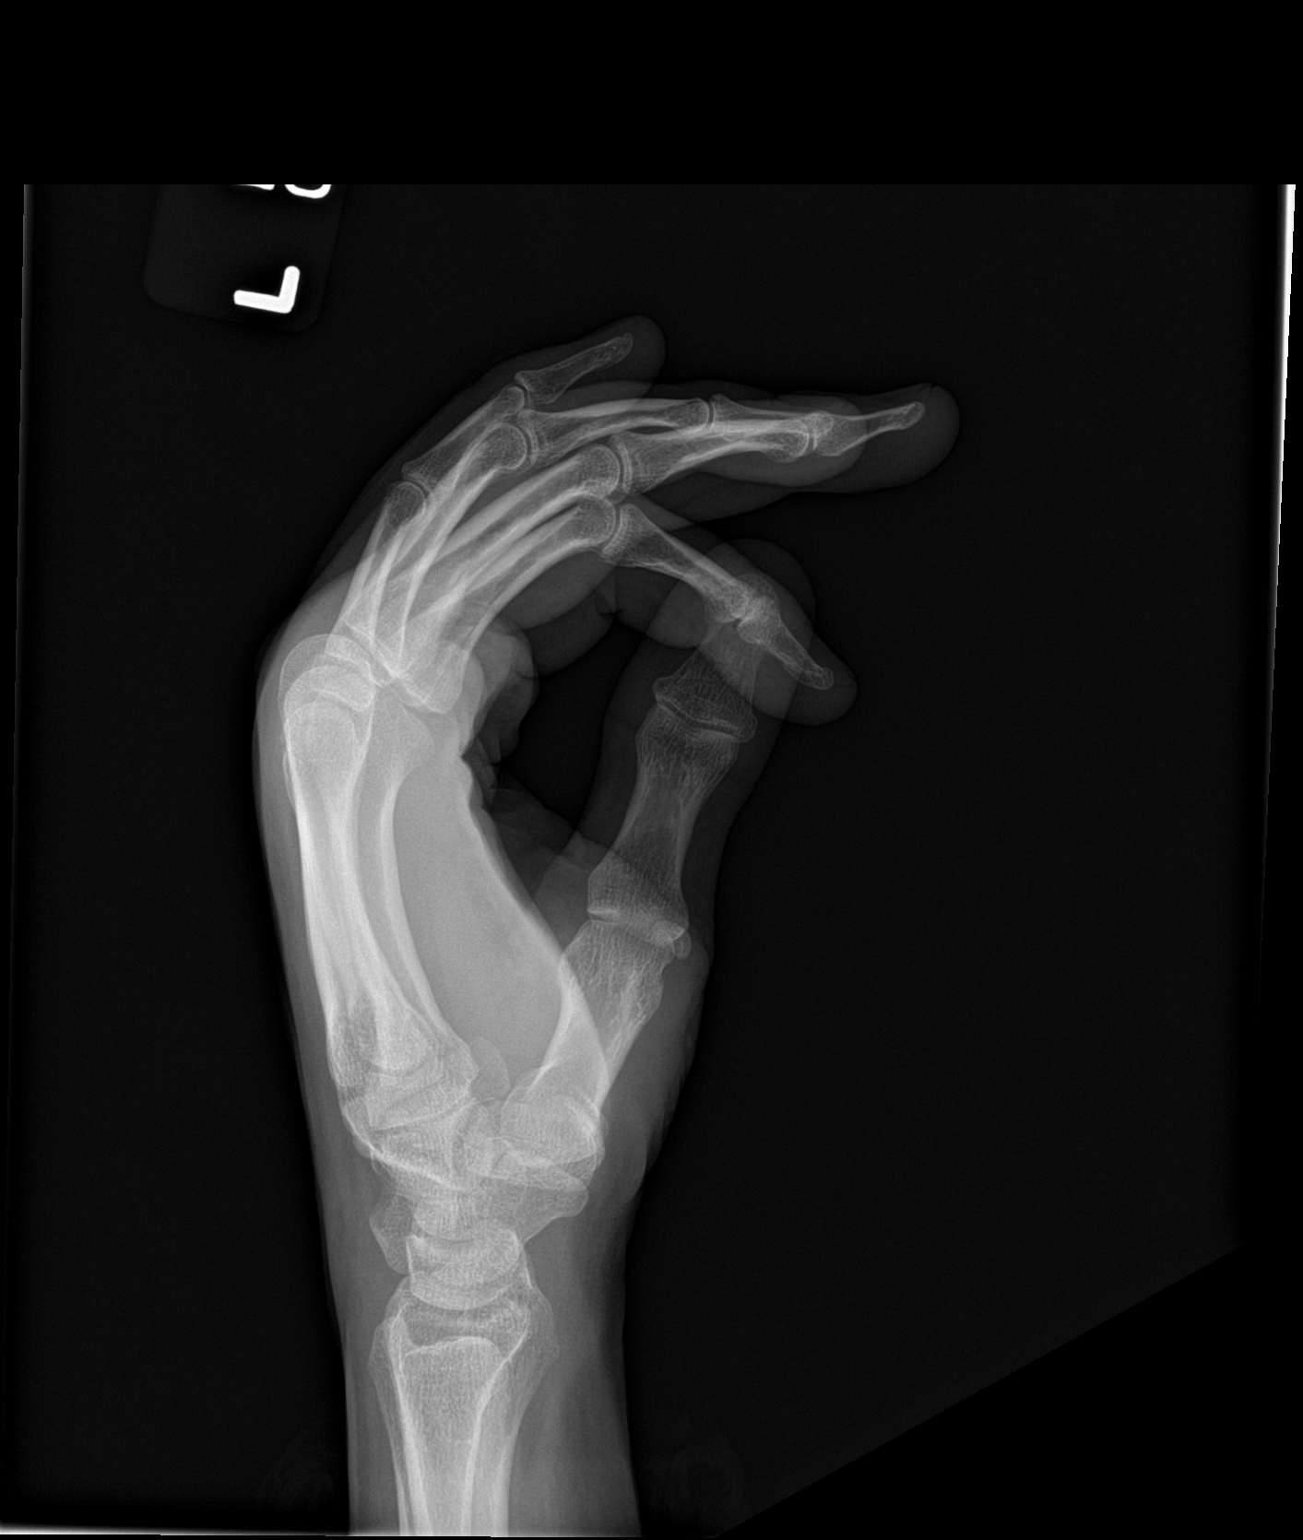

[3 of 3 positions shown; findings below may reference images not displayed]

FINDINGS: Soft tissue swelling at the PIP joint of the third digit. No change
in alignment of acute to subacute fracture involving the proximal
fifth proximal phalanx. No new fracture abnormality. No periostitis.
IMPRESSION: 1. No change in alignment of the acute to subacute fracture
involving the proximal aspect of the fifth proximal phalanx
2. Soft tissue swelling at the third PIP joint. No additional
fractures are visualized.

## 2020-02-07 ENCOUNTER — Encounter: Payer: Self-pay | Admitting: Licensed Clinical Social Worker

## 2020-02-07 ENCOUNTER — Other Ambulatory Visit: Payer: Self-pay

## 2020-02-07 ENCOUNTER — Ambulatory Visit (INDEPENDENT_AMBULATORY_CARE_PROVIDER_SITE_OTHER): Payer: No Typology Code available for payment source | Admitting: Licensed Clinical Social Worker

## 2020-02-07 DIAGNOSIS — F411 Generalized anxiety disorder: Secondary | ICD-10-CM | POA: Diagnosis not present

## 2020-02-07 NOTE — Progress Notes (Signed)
Virtual Visit via Video Note  I connected with Bradley Parks on 02/07/20 at 10:00 AM EST by a video enabled telemedicine application and verified that I am speaking with the correct person using two identifiers.   I discussed the limitations of evaluation and management by telemedicine and the availability of in person appointments. The patient expressed understanding and agreed to proceed.   I discussed the assessment and treatment plan with the patient. The patient was provided an opportunity to ask questions and all were answered. The patient agreed with the plan and demonstrated an understanding of the instructions.   The patient was advised to call back or seek an in-person evaluation if the symptoms worsen or if the condition fails to improve as anticipated.  I provided 53 minutes of non-face-to-face time during this encounter.   Heidi Dach, LCSW    THERAPIST PROGRESS NOTE  Session Time: 1000  Participation Level: Active  Behavioral Response: NeatAlertAnxious  Type of Therapy: Individual Therapy  Treatment Goals addressed: Anxiety  Interventions: Supportive  Summary: Bradley Parks is a 39 y.o. male who presents with continued symptoms related to his diagnosis. Jolly reports he's been doing well since our last session. He reports he saw the MD at the [place] who put him back on Zoloft, which Lachlan notes has improved his mood. Jos reports, "I think it's just a placebo right now, but it's good." LCSW validated Teyon's feelings and encouraged him to remember how his mood/emotional state changes when he decides to stop taking his medication, and then improves when he's on it. Burnis expressed understanding and agreement with this information. Nilan reported he is looking more forward to moving to [place] now, and feels like he needs a change. LCSW validated this idea and held space for Bosworth to discuss his feelings around the situation. 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 Inocencio reported when he goes to [place], he plans to find a different type of job. LCSW validated this idea and encouraged him to sit down and write out exactly what he would like in his next job. Amel expressed understanding and agreement with this information as well.  Suicidal/Homicidal: No   Therapist Response: Stanley continues to work towards his tx goals but has not yet reached them. We will continue to work on improving emotional regulation and distress tolerance moving forward.  Plan: Return again in 4 weeks.  Diagnosis: Axis I: Post Traumatic Stress Disorder    Axis II: No diagnosis    Heidi Dach, LCSW 02/07/2020

## 2020-03-06 ENCOUNTER — Other Ambulatory Visit: Payer: Self-pay

## 2020-03-06 ENCOUNTER — Ambulatory Visit (INDEPENDENT_AMBULATORY_CARE_PROVIDER_SITE_OTHER): Payer: No Typology Code available for payment source | Admitting: Licensed Clinical Social Worker

## 2020-03-06 ENCOUNTER — Encounter: Payer: Self-pay | Admitting: Licensed Clinical Social Worker

## 2020-03-06 DIAGNOSIS — F330 Major depressive disorder, recurrent, mild: Secondary | ICD-10-CM | POA: Diagnosis not present

## 2020-03-06 DIAGNOSIS — F411 Generalized anxiety disorder: Secondary | ICD-10-CM

## 2020-03-06 NOTE — Progress Notes (Signed)
Virtual Visit via Telephone Note  I connected with Bradley Parks on 03/06/20 at 10:00 AM EST by telephone and verified that I am speaking with the correct person using two identifiers.   I discussed the limitations, risks, security and privacy concerns of performing an evaluation and management service by telephone and the availability of in person appointments. I also discussed with the patient that there may be a patient responsible charge related to this service. The patient expressed understanding and agreed to proceed.    I discussed the assessment and treatment plan with the patient. The patient was provided an opportunity to ask questions and all were answered. The patient agreed with the plan and demonstrated an understanding of the instructions.   The patient was advised to call back or seek an in-person evaluation if the symptoms worsen or if the condition fails to improve as anticipated.  I provided 30 minutes of non-face-to-face time during this encounter.   Heidi Dach, LCSW   THERAPIST PROGRESS NOTE  Session Time: 1000  Participation Level: Active  Behavioral Response: NeatAlertAnxious  Type of Therapy: Individual Therapy  Treatment Goals addressed: Coping  Interventions: CBT  Summary: Bradley Parks is a 39 y.o. male who presents with continued symptoms related to his diagnosis. Bradley reports doing well since our last session. He reports he feels he is in a better mood since he recognizes there is an end date for him working at [employer]. He reports they are moving forward with relocating to [place], which he notes he is very excited about. He reports feeling he will be able to get a job that relates to his chosen career path, which will assist in him feeling fulfilled. LCSW validated all information provided by Bradley Parks, and encouraged him to ensure he is putting in the work he needs to on himself before leaving for [place], or many of the same problems will develop there as  well. We discussed the thought distortion of magical thinking, and Bradley Parks was encouraged to pay attention to how he's feeling throughout getting ready for the move. Bradley Parks expressed understanding and agreement. Bradley Parks reported he started a new medication that the MD prescribed, but noted he was not able to tolerate it but plans to try another. LCSW validated this information and highlighted that medication is only part of the puzzle. Bradley Parks expressed understanding and agreement with this information as well. LCSW held space for Bradley Parks to discuss how he was feeling around school and work, and offered insight where appropriate.   Suicidal/Homicidal: No  Therapist Response: Bradley Parks continues to work towards his tx goals but has not yet reached them. We will continue to work on improving emotional regulation and distress tolerance moving forward.   Plan: Return again in 2 weeks.  Diagnosis: Axis I: Post Traumatic Stress Disorder    Axis II: No diagnosis    Heidi Dach, LCSW 03/06/2020

## 2020-03-21 ENCOUNTER — Other Ambulatory Visit: Payer: Self-pay

## 2020-03-21 ENCOUNTER — Ambulatory Visit (INDEPENDENT_AMBULATORY_CARE_PROVIDER_SITE_OTHER): Payer: No Typology Code available for payment source | Admitting: Licensed Clinical Social Worker

## 2020-03-21 ENCOUNTER — Encounter: Payer: Self-pay | Admitting: Licensed Clinical Social Worker

## 2020-03-21 DIAGNOSIS — F411 Generalized anxiety disorder: Secondary | ICD-10-CM

## 2020-03-21 DIAGNOSIS — F330 Major depressive disorder, recurrent, mild: Secondary | ICD-10-CM

## 2020-03-21 NOTE — Progress Notes (Signed)
Virtual Visit via Video Note  I connected with Bradley Parks on 03/21/20 at  9:00 AM EDT by a video enabled telemedicine application and verified that I am speaking with the correct person using two identifiers.   I discussed the limitations of evaluation and management by telemedicine and the availability of in person appointments. The patient expressed understanding and agreed to proceed.  I discussed the assessment and treatment plan with the patient. The patient was provided an opportunity to ask questions and all were answered. The patient agreed with the plan and demonstrated an understanding of the instructions.   The patient was advised to call back or seek an in-person evaluation if the symptoms worsen or if the condition fails to improve as anticipated.  I provided 45 bminutes of non-face-to-face time during this encounter.   Heidi Dach, LCSW    THERAPIST PROGRESS NOTE  Session Time: 0900  Participation Level: Active  Behavioral Response: NeatAlertAnxious  Type of Therapy: Individual Therapy  Treatment Goals addressed: Coping  Interventions: Supportive  Summary: Bradley Parks is a 39 y.o. male who presents with continued symptoms related to his diagnosis. Osmel reports doing well since our last session. He reports he has continued looking for a job in [place], which he notes he has not had much luck with. Bradley Parks reports he's not worried about finding a job, as he feels confident that he will be able to once the move date gets closer. LCSW validated Terren's feelings and highlighted how Wisam is challenging negative thoughts and replacing them with more helpful ones. Bruce was able to see this as well and expressed understanding of how this could help him in the future. Bradley Parks reported, other than that, things have been going very well. He reports school is getting a little tough, but he is looking forward to being done with it. He reports he and his wife are getting along well, but  also noted he was not able to speak about it currently as he was home. LCSW held space for Bradley Parks to discuss various parts about the move that are both exciting and stressful. We discussed ways to approach each thing, and how Bradley Parks could continue to challenge negative thoughts as they come up.   Suicidal/Homicidal: No   Therapist Response: Bradley Parks continues to work towards his tx goals but has not yet reached them. We will continue to work on improving emotional regulation skills and distress tolerance moving forward.   Plan: Return again in 2 weeks.  Diagnosis: Axis I: Generalized Anxiety Disorder    Axis II: No diagnosis    Heidi Dach, LCSW 03/21/2020

## 2020-04-04 ENCOUNTER — Other Ambulatory Visit: Payer: Self-pay

## 2020-04-04 ENCOUNTER — Ambulatory Visit (INDEPENDENT_AMBULATORY_CARE_PROVIDER_SITE_OTHER): Payer: No Typology Code available for payment source | Admitting: Licensed Clinical Social Worker

## 2020-04-04 ENCOUNTER — Encounter: Payer: Self-pay | Admitting: Licensed Clinical Social Worker

## 2020-04-04 DIAGNOSIS — F330 Major depressive disorder, recurrent, mild: Secondary | ICD-10-CM | POA: Diagnosis not present

## 2020-04-04 DIAGNOSIS — F411 Generalized anxiety disorder: Secondary | ICD-10-CM

## 2020-04-04 NOTE — Progress Notes (Signed)
Virtual Visit via Telephone Note  I connected with Mikel Hardgrove on 04/04/20 at  1:30 PM EDT by telephone and verified that I am speaking with the correct person using two identifiers.   I discussed the limitations, risks, security and privacy concerns of performing an evaluation and management service by telephone and the availability of in person appointments. I also discussed with the patient that there may be a patient responsible charge related to this service. The patient expressed understanding and agreed to proceed    I discussed the assessment and treatment plan with the patient. The patient was provided an opportunity to ask questions and all were answered. The patient agreed with the plan and demonstrated an understanding of the instructions.   The patient was advised to call back or seek an in-person evaluation if the symptoms worsen or if the condition fails to improve as anticipated.  I provided 54 minutes of non-face-to-face time during this encounter.   Heidi Dach, LCSW    THERAPIST PROGRESS NOTE  Session Time: 1330  Participation Level: Active  Behavioral Response: CasualAlertAnxious  Type of Therapy: Individual Therapy  Treatment Goals addressed: Anxiety  Interventions: Supportive  Summary: Maijor Hornig is a 39 y.o. male who presents with continued symptoms related to his diagnosis. Tamar reports doing well since our last session. He reported he was off work for a week after being told he needed to use his time off or would lose it. He reports he was able to unwind and prepare for their upcoming move. 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 LCSW validated Anne's efforts into finding a job, and encouraged him to stay positive to new  opportunities. Maxemiliano expressed agreement and noted he has a plan for his career moving forward. LCSW held space for Kiev to discuss various aspects of his plan moving forward, and how he would accomplish his goals. We reviewed how to best challenge anxious thoughts, and how Dicky could utilize CBT skills when anxiety comes up due to the upcoming move. Emma expressed understanding and agreement.   Suicidal/Homicidal: No  Therapist Response: Jeromie continues to work towards his tx goals but has not yet reached them. We will continue to work on improving emotional regulation skills and distress tolerance moving forward.   Plan: Return again as needed.   Diagnosis: Axis I: Generalized Anxiety Disorder    Axis II: No diagnosis    Heidi Dach, LCSW 04/04/2020

## 2020-06-19 ENCOUNTER — Telehealth (HOSPITAL_COMMUNITY): Payer: Self-pay | Admitting: Psychiatry

## 2020-06-19 NOTE — Telephone Encounter (Signed)
06/19/20 11:51am Called patient left msg for the pt to call due to his DOS of 09/01/19-03/21/20 has not authoirzation from the [place].Marland Kitchen  We had authorization from [place] #[id number] - for DOS 06/15/18-08/17/19, but the patient continued to have appointments from 09/01/19, 09/21/19, 10/19/19, 11/22/19, 12/21/19, 01/17/20, 02/07/20, 03/06/20, and 03/21/20 without VA authorization.  I Nettie Elm) spoke with Felecia from a returned call   And according to her the patient should have gone to his pcp at the [place] to get authorization for those visits and she said because the patient didn't the DOS may not be paid.  The patient contract with the [facility] expired on 08/17/19.  I called the patient and left a message for the patient to call me - waiting for feedback.

## 2020-06-20 ENCOUNTER — Telehealth (HOSPITAL_COMMUNITY): Payer: Self-pay | Admitting: Psychiatry

## 2020-06-20 NOTE — Telephone Encounter (Signed)
06/20/20 9:21am Called patient due to needing VA authorization for DOS after 08/17/19 - left msg for patient to call the office.Marland KitchenMarguerite Olea

## 2020-06-25 ENCOUNTER — Telehealth (HOSPITAL_COMMUNITY): Payer: Self-pay | Admitting: Internal Medicine

## 2020-06-25 NOTE — Telephone Encounter (Signed)
Bradley Parks,  Per our conversation this afternoon on 06/25/20 at 1:13pm - as explained you will need to contact your PCP (Primary Care Physician) at the [facility] because your contact expired on 08/17/19.  We need authorization for the following visits:  If the we do not get authorization then each visit will be self-pay.  09/01/19 09/21/19 10/19/19 11/22/19 12/21/19 01/17/20 02/07/20 03/06/20 03/21/20  Thanks

## 2024-02-27 DEATH — deceased
